# Patient Record
Sex: Female | Born: 1980 | Race: White | Hispanic: No | Marital: Single | Smoking: Never smoker
Health system: Southern US, Community
[De-identification: ages and names within clinical notes are randomized; demographics above are authoritative.]

## PROBLEM LIST (undated history)

## (undated) DIAGNOSIS — Z789 Other specified health status: Secondary | ICD-10-CM

## (undated) HISTORY — PX: OTHER SURGICAL HISTORY: SHX169

---

## 2003-01-15 ENCOUNTER — Other Ambulatory Visit: Admission: RE | Admit: 2003-01-15 | Discharge: 2003-01-15 | Payer: Self-pay | Admitting: Obstetrics and Gynecology

## 2003-07-09 ENCOUNTER — Inpatient Hospital Stay (HOSPITAL_COMMUNITY): Admission: RE | Admit: 2003-07-09 | Discharge: 2003-07-13 | Payer: Self-pay | Admitting: Obstetrics and Gynecology

## 2003-07-09 ENCOUNTER — Encounter (INDEPENDENT_AMBULATORY_CARE_PROVIDER_SITE_OTHER): Payer: Self-pay | Admitting: Specialist

## 2005-05-17 ENCOUNTER — Emergency Department (HOSPITAL_COMMUNITY): Admission: EM | Admit: 2005-05-17 | Discharge: 2005-05-17 | Payer: Self-pay | Admitting: Family Medicine

## 2007-11-05 ENCOUNTER — Emergency Department (HOSPITAL_COMMUNITY): Admission: EM | Admit: 2007-11-05 | Discharge: 2007-11-05 | Payer: Self-pay | Admitting: Emergency Medicine

## 2008-10-12 ENCOUNTER — Emergency Department (HOSPITAL_COMMUNITY): Admission: EM | Admit: 2008-10-12 | Discharge: 2008-10-12 | Payer: Self-pay | Admitting: Emergency Medicine

## 2009-03-18 ENCOUNTER — Emergency Department (HOSPITAL_COMMUNITY): Admission: EM | Admit: 2009-03-18 | Discharge: 2009-03-18 | Payer: Self-pay | Admitting: Family Medicine

## 2009-04-27 ENCOUNTER — Encounter: Admission: RE | Admit: 2009-04-27 | Discharge: 2009-04-27 | Payer: Self-pay | Admitting: Obstetrics and Gynecology

## 2009-05-25 ENCOUNTER — Emergency Department (HOSPITAL_COMMUNITY): Admission: EM | Admit: 2009-05-25 | Discharge: 2009-05-25 | Payer: Self-pay | Admitting: Family Medicine

## 2010-08-26 NOTE — Discharge Summary (Signed)
NAME:  Shirley Ryan, Shirley Ryan                        ACCOUNT NO.:  [id number]   MEDICAL RECORD NO.:  [id number]                   PATIENT TYPE:  INP   LOCATION:  9116                                 FACILITY:  [facility]   PHYSICIAN:  Malachi Pro. Ambrose Mantle, M.D.              DATE OF BIRTH:  24-Jun-1980   DATE OF ADMISSION:  07/09/2003  DATE OF DISCHARGE:                                 DISCHARGE SUMMARY   HOSPITAL COURSE:  This is a 30 year old white female para 0-0-2-0 gravida 3  with [facility] July 13, 2003 admitted for primary cesarean section because of  breech presentation.  Blood group and type A positive, negative antibody,  negative sickle cell, nonreactive serology, rubella immune, hepatitis B  surface antigen negative, GC and chlamydia negative, triple screen normal, 1-  hour Glucola 117, group B strep positive.  The patient was noted to have a  breech presentation, it has persisted, and she is admitted for C-section.  She underwent a low transverse cervical C-section by Dr. Ambrose Mantle with Dr.  Senaida Ores assisting under spinal anesthesia, a female infant, 9 pounds 0  ounces, Apgars 9 at one and 9 at five minutes.  Blood loss about 500 mL.  Postpartum the patient did very well.  She ambulated well without  difficulty, tolerated a regular diet, passed flatus and had a bowel  movement.  The staples were removed, strips were applied, and she was  discharged on postpartum day #4.  She did not want earlier discharge.   LABORATORY DATA:  Admission hemoglobin 11.3; hematocrit 33.7; white count  10,500; platelet count 231,000.  Follow-up hemoglobin 9.0; hematocrit 26.5;  white count 12,000.  Urinalysis showed 30 mg% protein.  Comprehensive  metabolic profile was normal except for abnormalities usually associated  with pregnancy.   FINAL DIAGNOSES:  1. Intrauterine pregnancy at 39+ weeks.  2. Breech presentation.   OPERATION:  Low transverse cervical C-section.   FINAL CONDITION:  Improved.   Instructions  include our regular discharge instruction booklet.  The patient  is advised to return to the office in 10 days to 2 weeks for follow-up  examination.  Percocet 5/325 #20 tablets one q.4-6h. as needed for pain is  given at discharge.                                               Malachi Pro. Ambrose Mantle, M.D.    TFH/MEDQ  D:  07/13/2003  T:  07/13/2003  Job:  [id number]

## 2010-08-26 NOTE — Op Note (Signed)
NAME:  Shirley Ryan, Shirley Ryan                        ACCOUNT NO.:  [id number]   MEDICAL RECORD NO.:  [id number]                   PATIENT TYPE:  INP   LOCATION:  9199                                 [facility]ILITY[facility]  [facility]   PHYSICIAN:  Malachi Pro. Ambrose Mantle, M.D.              DATE OF BIRTH:  Nov 28, 1980   DATE OF PROCEDURE:  07/09/2003  DATE OF DISCHARGE:                                 OPERATIVE REPORT   PREOPERATIVE DIAGNOSES:  1. Intrauterine pregnancy at 39 weeks.  2. Breech presentation.   POSTOPERATIVE DIAGNOSES:  1. Intrauterine pregnancy at 39 weeks.  2. Breech presentation.   OPERATION:  Low transverse cervical cesarean section.   OPERATOR:  Malachi Pro. Ambrose Mantle, M.D.   ASSISTANT:  Huel Cote, M.D.   Spinal anesthesia.   The patient was brought to the operating room, fetal heart tones were  normal.  She was given a spinal anesthetic by Dr. Tacy Dura.  She was placed in  the left lateral tilt position with her legs spread.  The abdomen and  urethra were prepped with Betadine solution.  A Foley catheter was inserted  to straight drain.  The patient was then placed supine in a left lateral  tilt position.  The abdomen was draped as a sterile field.  Anesthesia was  confirmed and a transverse incision was made and carried in layers through  the skin, subcutaneous tissue, and fascia.  The fascia was then separated  from the rectus muscle superiorly and inferiorly.  The rectus muscle was  split in the midline and the peritoneum was opened vertically. An incision  was made into the lower uterine segment peritoneum and it was extended  laterally, and then the bladder was pushed inferiorly.  An incision was made  into the myometrium but then the final part of the entry into the amniotic  sac was made with my finger.  I then separated the uterine muscle by pulling  inferiorly and superiorly on the incision.  The baby's breech was sitting  under the incision.  I gradually with Dr. Berenda Morale  assistance on fundal  pressure brought the butt through the incisional opening, delivered the baby  up to the shoulders.  The left shoulder delivered first, shoulder and arm,  and the right shoulder delivered second.  I then was able to grasp the  baby's mouth and pull the baby on through the incisional opening with fundal  pressure, and then I placed a little suprapubic pressure to get the head on  out.  The cord was clamped.  The infant was given to Dr. Alison Murray.  He  assigned the baby's Apgars of 9 at one and 9 at five minutes.  The baby was  a female, and it weighed 9 pounds 0 ounces.  Routine cord blood studies were  obtained.  The placenta was removed.  The cervix was dilated with ring  forceps.  The inside of the uterus was inspected  and found to be free of  debris.  The uterine incision was then closed with two running sutures of 0  Vicryl, locking the first layer, nonlocking on the second layer.  A couple  of extra sutures were required for complete hemostasis.  Liberal irrigation  confirmed hemostasis.  Both tubes and ovaries appeared normal, as did the  uterus.  I then closed the rectus muscle with interrupted sutures of 0  Vicryl.  The  fascia was closed with two running sutures of 0 Vicryl, the subcu with a  running 3-0 Vicryl, and the skin was closed with automatic staples.  The  patient seemed to tolerate the procedure well.  Blood loss was about 500 mL.  Sponge and needle counts were correct, and she was returned to recovery in  satisfactory condition.                                               Malachi Pro. Ambrose Mantle, M.D.    TFH/MEDQ  D:  07/09/2003  T:  07/09/2003  Job:  [id number]

## 2010-08-26 NOTE — H&P (Signed)
NAME:  Shirley Ryan, Shirley Ryan                        ACCOUNT NO.:  [id number]   MEDICAL RECORD NO.:  [id number]                   PATIENT TYPE:  INP   LOCATION:  NA                                   [facility]   PHYSICIAN:  Malachi Pro. Ambrose Mantle, M.D.              DATE OF BIRTH:  07-03-1980   DATE OF ADMISSION:  DATE OF DISCHARGE:                                HISTORY & PHYSICAL   HISTORY OF PRESENT ILLNESS:  This is a 30 year old white female, para 0-0-2-  0, gravida 3, with [facility] July 13, 2003, by ultrasound at 13 weeks and 1 day,  admitted for C-section because of breech presentation.  This patient  underwent a vaginal ultrasound on January 06, 2003.  The crown/rump length  was 5.95 cm, 13 weeks 1 day, and [facility] July 13, 2003.  Followup ultrasound on  February 11, 2003, showed gestational age [redacted] weeks 2 days and [facility] July 13, 2003.  The cervix was 4.1 cm long and closed and all anatomy was felt to be  normal.  The patient's blood group and type is A positive.  Negative  antibody.  Negative sickle cell.  Nonreactive serology.  Rubella immune.  Hepatitis B surface antigen negative.  GC and chlamydia negative.  Triple  screen normal.  One-hour Glucola 117.  Group B Streptococcus positive.  The  patient's urine at her prenatal visit showed group B Streptococcus.  She was  treated with penicillin.  Her subsequent prenatal course has been relatively  unremarkable.  She had no major problems.  On her visit on July 02, 2003,  she was noted to have breech presentation.  It was still felt to be breech  on July 08, 2003.  She was offered inversion on July 02, 2003, but  declined and preferred to proceed with C-section.   ALLERGIES:  No known allergies.   PAST SURGICAL HISTORY:  No operations.   PAST MEDICAL HISTORY:  Illnesses:  1. Pyelonephritis as a child.  2. Some anxiety.   OBSTETRICAL HISTORY:  Two early abortions in 2002 and 2003.   FAMILY HISTORY:  Maternal great-grandmother with heart  disease and high  blood pressure.  Maternal grandmother with breast cancer.  Maternal aunt  with Hodgkin's disease.  Mother has migraines, anxiety, and depression.  Paternal grandfather with diabetes.   PHYSICAL EXAMINATION:  GENERAL APPEARANCE:  A well-developed, well-  developed, white female in no distress.  VITAL SIGNS:  Blood pressure 130/84, pulse 80.  HEENT:  Normal.  HEART:  Normal.  LUNGS:  Normal.  ABDOMEN:  Soft.  Fundal height 40 cm.  Vertex in the maternal right upper  quadrant.  Breech presentation.  PELVIC:  Cervix a fingertip and long.   ADMITTING IMPRESSION:  Intrauterine pregnancy, 39+ weeks, with breech  presentation.  The patient is admitted for C-section.  Malachi Pro. Ambrose Mantle, M.D.    TFH/MEDQ  D:  07/08/2003  T:  07/08/2003  Job:  [id number]

## 2012-07-17 ENCOUNTER — Other Ambulatory Visit: Payer: Self-pay | Admitting: Obstetrics and Gynecology

## 2012-07-17 DIAGNOSIS — N63 Unspecified lump in unspecified breast: Secondary | ICD-10-CM

## 2012-07-18 ENCOUNTER — Other Ambulatory Visit: Payer: Self-pay | Admitting: Obstetrics and Gynecology

## 2012-07-18 DIAGNOSIS — N63 Unspecified lump in unspecified breast: Secondary | ICD-10-CM

## 2012-07-31 ENCOUNTER — Other Ambulatory Visit: Payer: Self-pay | Admitting: Obstetrics and Gynecology

## 2012-07-31 ENCOUNTER — Ambulatory Visit
Admission: RE | Admit: 2012-07-31 | Discharge: 2012-07-31 | Disposition: A | Discharge status: Home / Self Care | Payer: BC Managed Care – PPO | Source: Ambulatory Visit | Attending: Obstetrics and Gynecology | Admitting: Obstetrics and Gynecology

## 2012-07-31 DIAGNOSIS — N63 Unspecified lump in unspecified breast: Secondary | ICD-10-CM

## 2013-07-29 ENCOUNTER — Other Ambulatory Visit: Payer: Self-pay | Admitting: Obstetrics and Gynecology

## 2013-07-29 DIAGNOSIS — N6311 Unspecified lump in the right breast, upper outer quadrant: Secondary | ICD-10-CM

## 2013-07-29 DIAGNOSIS — N6325 Unspecified lump in the left breast, overlapping quadrants: Secondary | ICD-10-CM

## 2013-07-29 DIAGNOSIS — N6320 Unspecified lump in the left breast, unspecified quadrant: Secondary | ICD-10-CM

## 2013-08-06 ENCOUNTER — Ambulatory Visit
Admission: RE | Admit: 2013-08-06 | Discharge: 2013-08-06 | Disposition: A | Discharge status: Home / Self Care | Payer: No Typology Code available for payment source | Source: Ambulatory Visit | Attending: Obstetrics and Gynecology | Admitting: Obstetrics and Gynecology

## 2013-08-06 ENCOUNTER — Encounter (INDEPENDENT_AMBULATORY_CARE_PROVIDER_SITE_OTHER): Payer: Self-pay

## 2013-08-06 DIAGNOSIS — N6325 Unspecified lump in the left breast, overlapping quadrants: Secondary | ICD-10-CM

## 2013-08-06 DIAGNOSIS — N6311 Unspecified lump in the right breast, upper outer quadrant: Secondary | ICD-10-CM

## 2013-08-06 DIAGNOSIS — N6320 Unspecified lump in the left breast, unspecified quadrant: Secondary | ICD-10-CM

## 2014-05-28 ENCOUNTER — Emergency Department (INDEPENDENT_AMBULATORY_CARE_PROVIDER_SITE_OTHER)
Admission: EM | Admit: 2014-05-28 | Discharge: 2014-05-28 | Disposition: A | Discharge status: Home / Self Care | Payer: Self-pay | Source: Home / Self Care | Attending: Family Medicine | Admitting: Family Medicine

## 2014-05-28 ENCOUNTER — Encounter (HOSPITAL_COMMUNITY): Payer: Self-pay | Admitting: Emergency Medicine

## 2014-05-28 DIAGNOSIS — T63461A Toxic effect of venom of wasps, accidental (unintentional), initial encounter: Secondary | ICD-10-CM

## 2014-05-28 NOTE — Discharge Instructions (Signed)
As we discussed, application of ice packs, ibuprofen for discomfort and topical benadryl and topical hydrocortisone creams for redness and itching. May also use oral benadryl as directed on packaging. Expect improvement over the next few days. If symptoms worsen, please return for re-evaluation.  Bee, Wasp, or Hornet Sting Your caregiver has diagnosed you as having an insect sting. An insect sting appears as a red lump in the skin that sometimes has a tiny hole in the center, or it may have a stinger in the center of the wound. The most common stings are from wasps, hornets and bees. Individuals have different reactions to insect stings.  A normal reaction may cause pain, swelling, and redness around the sting site.  A localized allergic reaction may cause swelling and redness that extends beyond the sting site.  A large local reaction may continue to develop over the next 12 to 36 hours.  On occasion, the reactions can be severe (anaphylactic reaction). An anaphylactic reaction may cause wheezing; difficulty breathing; chest pain; fainting; raised, itchy, red patches on the skin; a sick feeling to your stomach (nausea); vomiting; cramping; or diarrhea. If you have had an anaphylactic reaction to an insect sting in the past, you are more likely to have one again. HOME CARE INSTRUCTIONS   With bee stings, a small sac of poison is left in the wound. Brushing across this with something such as a credit card, or anything similar, will help remove this and decrease the amount of the reaction. This same procedure will not help a wasp sting as they do not leave behind a stinger and poison sac.  Apply a cold compress for 10 to 20 minutes every hour for 1 to 2 days, depending on severity, to reduce swelling and itching.  To lessen pain, a paste made of water and baking soda may be rubbed on the bite or sting and left on for 5 minutes.  To relieve itching and swelling, you may use take medication or apply  medicated creams or lotions as directed.  Only take over-the-counter or prescription medicines for pain, discomfort, or fever as directed by your caregiver.  Wash the sting site daily with soap and water. Apply antibiotic ointment on the sting site as directed.  If you suffered a severe reaction:  If you did not require hospitalization, an adult will need to stay with you for 24 hours in case the symptoms return.  You may need to wear a medical bracelet or necklace stating the allergy.  You and your family need to learn when and how to use an anaphylaxis kit or epinephrine injection.  If you have had a severe reaction before, always carry your anaphylaxis kit with you. SEEK MEDICAL CARE IF:   None of the above helps within 2 to 3 days.  The area becomes red, warm, tender, and swollen beyond the area of the bite or sting.  You have an oral temperature above 102 F (38.9 C). SEEK IMMEDIATE MEDICAL CARE IF:  You have symptoms of an allergic reaction which are:  Wheezing.  Difficulty breathing.  Chest pain.  Lightheadedness or fainting.  Itchy, raised, red patches on the skin.  Nausea, vomiting, cramping or diarrhea. ANY OF THESE SYMPTOMS MAY REPRESENT A SERIOUS PROBLEM THAT IS AN EMERGENCY. Do not wait to see if the symptoms will go away. Get medical help right away. Call your local emergency services (911 in [place] DO NOT drive yourself to the hospital. MAKE SURE YOU:   Understand these  instructions.  Will watch your condition.  Will get help right away if you are not doing well or get worse. Document Released: 03/27/2005 Document Revised: 06/19/2011 Document Reviewed: 09/11/2009 [facility] Patient Information 2015 [place], [place]. This information is not intended to replace advice given to you by your health care provider. Make sure you discuss any questions you have with your health care provider.

## 2014-05-28 NOTE — ED Notes (Signed)
Pt states that she was stung by a wasp on 05/25/2014. Pt states site is getting worse and more red.

## 2014-05-28 NOTE — ED Provider Notes (Signed)
CSN: [id number]     Arrival date & time 05/28/14  0806 History   First MD Initiated Contact with Patient 05/28/14 [phone]     Chief Complaint  Patient presents with  . Insect Bite    wasp sting   (Consider location/radiation/quality/duration/timing/severity/associated sxs/prior Treatment) HPI Comments: Patient reports herself to have been stung by a wasp on 05/26/2014. She has tried taking a few doses of oral benadryl for itching and burning sensation at site and this has brought some improvement. Area remains a bit sore and erythematous and she come for evaluation to learn when redness will resolve and if she should be treating area any differently at home.   The history is provided by the patient.    History reviewed. No pertinent past medical history. History reviewed. No pertinent past surgical history. History reviewed. No pertinent family history. History  Substance Use Topics  . Smoking status: Never Smoker   . Smokeless tobacco: Not on file  . Alcohol Use: No   OB History    No data available     Review of Systems  All other systems reviewed and are negative.   Allergies  Review of patient's allergies indicates no known allergies.  Home Medications   Prior to Admission medications   Not on File   BP 138/95 mmHg  Pulse 60  Temp(Src) 98.3 F (36.8 C) (Oral)  Resp 14  SpO2 100%  LMP 05/27/2014 Physical Exam  Constitutional: She is oriented to person, place, and time. She appears well-developed and well-nourished. No distress.  HENT:  Head: Normocephalic and atraumatic.  Eyes: Conjunctivae are normal.  Cardiovascular: Normal rate.   Pulmonary/Chest: Effort normal.  Neurological: She is alert and oriented to person, place, and time.  Skin: Skin is warm and dry. There is erythema.  4x5 cm oval shaped area of erythema at LLQ of abdomen. No fluctuance or induration. No retained stinger  Psychiatric: She has a normal mood and affect. Her behavior is normal.   Nursing note and vitals reviewed.   ED Course  Procedures (including critical care time) Labs Review Labs Reviewed - No data to display  Imaging Review No results found.   MDM   1. Wasp sting, accidental or unintentional, initial encounter   As we discussed, application of ice packs, ibuprofen for discomfort and topical benadryl and topical hydrocortisone creams for redness and itching. May also use oral benadryl as directed on packaging. Expect improvement over the next few days. If symptoms worsen, please return for re-evaluation.    Ria ClockJennifer Lee H Woodie Trusty, GeorgiaPA 05/28/14 1020

## 2014-07-27 ENCOUNTER — Emergency Department (HOSPITAL_COMMUNITY)
Admission: EM | Admit: 2014-07-27 | Discharge: 2014-07-27 | Disposition: A | Discharge status: Home / Self Care | Payer: Self-pay

## 2014-09-12 ENCOUNTER — Encounter (HOSPITAL_COMMUNITY): Payer: Self-pay | Admitting: *Deleted

## 2014-09-12 ENCOUNTER — Emergency Department (HOSPITAL_COMMUNITY)
Admission: EM | Admit: 2014-09-12 | Discharge: 2014-09-12 | Disposition: A | Discharge status: Home / Self Care | Payer: Self-pay | Attending: Emergency Medicine | Admitting: Emergency Medicine

## 2014-09-12 DIAGNOSIS — S161XXA Strain of muscle, fascia and tendon at neck level, initial encounter: Secondary | ICD-10-CM | POA: Insufficient documentation

## 2014-09-12 DIAGNOSIS — Y9389 Activity, other specified: Secondary | ICD-10-CM | POA: Insufficient documentation

## 2014-09-12 DIAGNOSIS — Y998 Other external cause status: Secondary | ICD-10-CM | POA: Insufficient documentation

## 2014-09-12 DIAGNOSIS — Y92410 Unspecified street and highway as the place of occurrence of the external cause: Secondary | ICD-10-CM | POA: Insufficient documentation

## 2014-09-12 MED ORDER — ACETAMINOPHEN 325 MG PO TABS
650.0000 mg | ORAL_TABLET | Freq: Once | ORAL | Status: AC
  Administered 2014-09-12: 650 mg via ORAL
  Filled 2014-09-12: qty 2

## 2014-09-12 MED ORDER — METHOCARBAMOL 500 MG PO TABS: 1000.0000 mg | ORAL_TABLET | Freq: Four times a day (QID) | ORAL | Status: DC

## 2014-09-12 NOTE — ED Notes (Signed)
Declined W/C at D/C and was escorted to lobby by RN. 
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
## 2016-02-15 ENCOUNTER — Other Ambulatory Visit: Payer: Self-pay | Admitting: Family Medicine

## 2016-02-16 LAB — CMP12+LP+TP+TSH+6AC+CBC/D/PLT
ALK PHOS: 67 IU/L (ref 39–117)
ALT: 12 IU/L (ref 0–32)
AST: 11 IU/L (ref 0–40)
Albumin/Globulin Ratio: 2 (ref 1.2–2.2)
Albumin: 4.3 g/dL (ref 3.5–5.5)
BASOS: 0 %
BUN/Creatinine Ratio: 14 (ref 9–23)
BUN: 9 mg/dL (ref 6–20)
Basophils Absolute: 0 10*3/uL (ref 0.0–0.2)
Bilirubin Total: 0.6 mg/dL (ref 0.0–1.2)
CALCIUM: 8.7 mg/dL (ref 8.7–10.2)
CHLORIDE: 102 mmol/L (ref 96–106)
CHOL/HDL RATIO: 3.2 ratio (ref 0.0–4.4)
CREATININE: 0.64 mg/dL (ref 0.57–1.00)
Cholesterol, Total: 160 mg/dL (ref 100–199)
EOS (ABSOLUTE): 0.1 10*3/uL (ref 0.0–0.4)
Eos: 1 %
Free Thyroxine Index: 1.6 (ref 1.2–4.9)
GFR, EST AFRICAN AMERICAN: 135 mL/min/{1.73_m2} (ref >59)
GFR, EST NON AFRICAN AMERICAN: 117 mL/min/{1.73_m2} (ref >59)
GGT: 15 IU/L (ref 0–60)
GLOBULIN, TOTAL: 2.2 g/dL (ref 1.5–4.5)
GLUCOSE: 84 mg/dL (ref 65–99)
HDL: 50 mg/dL (ref >39)
HEMATOCRIT: 36.7 % (ref 34.0–46.6)
Hemoglobin: 12.1 g/dL (ref 11.1–15.9)
IMMATURE GRANS (ABS): 0 10*3/uL (ref 0.0–0.1)
Immature Granulocytes: 0 %
Iron: 90 ug/dL (ref 27–159)
LDH: 200 IU/L (ref 119–226)
LDL CALC: 98 mg/dL (ref 0–99)
Lymphocytes Absolute: 2.7 10*3/uL (ref 0.7–3.1)
Lymphs: 35 %
MCH: 29.8 pg (ref 26.6–33.0)
MCHC: 33 g/dL (ref 31.5–35.7)
MCV: 90 fL (ref 79–97)
MONOCYTES: 7 %
MONOS ABS: 0.5 10*3/uL (ref 0.1–0.9)
NEUTROS ABS: 4.3 10*3/uL (ref 1.4–7.0)
Neutrophils: 57 %
PHOSPHORUS: 2.9 mg/dL (ref 2.5–4.5)
POTASSIUM: 3.8 mmol/L (ref 3.5–5.2)
Platelets: 273 10*3/uL (ref 150–379)
RBC: 4.06 x10E6/uL (ref 3.77–5.28)
RDW: 12.7 % (ref 12.3–15.4)
SODIUM: 140 mmol/L (ref 134–144)
T3 Uptake Ratio: 24 % (ref 24–39)
T4 TOTAL: 6.8 ug/dL (ref 4.5–12.0)
TSH: 1.3 u[IU]/mL (ref 0.450–4.500)
Total Protein: 6.5 g/dL (ref 6.0–8.5)
Triglycerides: 62 mg/dL (ref 0–149)
URIC ACID: 4.1 mg/dL (ref 2.5–7.1)
VLDL Cholesterol Cal: 12 mg/dL (ref 5–40)
WBC: 7.7 10*3/uL (ref 3.4–10.8)

## 2016-02-16 LAB — HGB A1C W/O EAG: HEMOGLOBIN A1C: 5 % (ref 4.8–5.6)

## 2016-08-22 ENCOUNTER — Ambulatory Visit: Payer: Self-pay | Admitting: Registered Nurse

## 2016-08-22 VITALS — BP 101/70 | HR 84 | Temp 98.6°F

## 2016-08-22 DIAGNOSIS — N63 Unspecified lump in unspecified breast: Secondary | ICD-10-CM | POA: Insufficient documentation

## 2016-08-22 DIAGNOSIS — W57XXXA Bitten or stung by nonvenomous insect and other nonvenomous arthropods, initial encounter: Principal | ICD-10-CM

## 2016-08-22 DIAGNOSIS — S30861A Insect bite (nonvenomous) of abdominal wall, initial encounter: Secondary | ICD-10-CM

## 2016-08-22 DIAGNOSIS — R81 Glycosuria: Secondary | ICD-10-CM | POA: Insufficient documentation

## 2016-08-22 MED ORDER — DOXYCYCLINE HYCLATE 100 MG PO TABS: 200.0000 mg | ORAL_TABLET | Freq: Once | ORAL | 0 refills | Status: AC

## 2016-08-22 MED ORDER — DIPHENHYDRAMINE HCL 25 MG PO CAPS: 25.0000 mg | ORAL_CAPSULE | Freq: Three times a day (TID) | ORAL | 0 refills | Status: DC | PRN

## 2016-08-22 NOTE — Progress Notes (Signed)
Pt reports tick bite on Friday. Small, brown tick. Was attached. Did remove head from site. Now with approx 1.5cm area of erythema around site. No streaking. Mild swelling, improved from yesterday. Pt denies pain at site.

## 2016-08-22 NOTE — Patient Instructions (Signed)
Doxycyline 200mg  by mouth once now with food May continue benadryl 25-50mg  by mouth every 4-6 hours as needed for itching OTC Shower with soap and water daily Do not itch area to prevent secondary skin infection Follow up for re-evaluation if enlarging rash, fever, joint aches, purulent discharge, muscle aches, headache, flu like symptoms  Tick Bite Information, Adult Ticks are insects that draw blood for food. Most ticks live in shrubs and grassy areas. They climb onto people and animals that brush against the leaves and grasses that they rest on. Then they bite, attaching themselves to the skin. Most ticks are harmless, but some ticks carry germs that can spread to a person through a bite and cause a disease. To reduce your risk of getting a disease from a tick bite, it is important to take steps to prevent tick bites. It is also important to check for ticks after being outdoors. If you find that a tick has attached to you, watch for symptoms of disease. How can I prevent tick bites? Take these steps to help prevent tick bites when you are outdoors in an area where ticks are found:  Use insect repellent that has DEET (20% or higher), picaridin, or IR3535 in it. Use it on:  Skin that is showing.  The top of your boots.  Your pant legs.  Your sleeve cuffs.  For repellent products that contain permethrin, follow product instructions. Use these products on:  Clothing.  Gear.  Boots.  Tents.  Wear protective clothing. Long sleeves and long pants offer the best protection from ticks.  Wear light-colored clothing so you can see ticks more easily.  Tuck your pant legs into your socks.  If you go walking on a trail, stay in the middle of the trail so your skin, hair, and clothing do not touch the bushes.  Avoid walking through areas with long grass.  Check for ticks on your clothing, hair, and skin often while you are outside, and check again before you go inside. Make sure to  check the places that ticks attach themselves most often. These places include the scalp, neck, armpits, waist, groin, and joint areas. Ticks that carry a disease called Lyme disease have to be attached to the skin for 24-48 hours. Checking for ticks every day will lessen your risk of this and other diseases.  When you come indoors, wash your clothes and take a shower or a bath right away. Dry your clothes in a dryer on high heat for at least 60 minutes. This will kill any ticks in your clothes. What is the proper way to remove a tick? If you find a tick on your body, remove it as soon as possible. Removing a tick sooner rather than later can prevent germs from passing from the tick to your body. To remove a tick that is crawling on your skin but has not bitten:  Go outdoors and brush the tick off.  Remove the tick with tape or a lint roller. To remove a tick that is attached to your skin:  Wash your hands.  If you have latex gloves, put them on.  Use tweezers, curved forceps, or a tick-removal tool to gently grasp the tick as close to your skin and the tick's head as possible.  Gently pull with steady, upward pressure until the tick lets go. When removing the tick:  Take care to keep the tick's head attached to its body.  Do not twist or jerk the tick. This can  make the tick's head or mouth break off.  Do not squeeze or crush the tick's body. This could force disease-carrying fluids from the tick into your body. Do not try to remove a tick with heat, alcohol, petroleum jelly, or fingernail polish. Using these methods can cause the tick to salivate and regurgitate into your bloodstream, increasing your risk of getting a disease. What should I do after removing a tick?  Clean the bite area with soap and water, rubbing alcohol, or an iodine scrub.  If an antiseptic cream or ointment is available, apply a small amount to the bite site.  Wash and disinfect any instruments that you used to  remove the tick. How should I dispose of a tick? To dispose of a live tick, use one of these methods:  Place it in rubbing alcohol.  Place it in a sealed bag or container.  Wrap it tightly in tape.  Flush it down the toilet. Contact a health care provider if:  You have symptoms of a disease after a tick bite. Symptoms of a tick-borne disease can occur from moments after the tick bites to up to 30 days after a tick is removed. Symptoms include:  Muscle, joint, or bone pain.  Difficulty walking or moving your legs.  Numbness in the legs.  Paralysis.  Red rash around the tick bite area that is shaped like a target or a "bull's-eye."  Redness and swelling in the area of the tick bite.  Fever.  Repeated vomiting.  Diarrhea.  Weight loss.  Tender, swollen lymph glands.  Shortness of breath.  Cough.  Pain in the abdomen.  Headache.  Abnormal tiredness.  A change in your level of consciousness.  Confusion. Get help right away if:  You are not able to remove a tick.  A part of a tick breaks off and gets stuck in your skin.  Your symptoms get worse. Summary  Ticks may carry germs that can spread to a person through a bite and cause disease.  Wear protective clothing and use insect repellent to prevent tick bites. Follow product instructions.  If you find a tick on your body, remove it as soon as possible. If the tick is attached, do not try to remove with heat, alcohol, petroleum jelly, or fingernail polish.  Remove the attached tick using tweezers, curved forceps, or a tick-removal tool. Gently pull with steady, upward pressure until the tick lets go. Do not twist or jerk the tick. Do not squeeze or crush the tick's body.  If you have symptoms after being bitten by a tick, contact a health care provider. This information is not intended to replace advice given to you by your health care provider. Make sure you discuss any questions you have with your health  care provider. Document Released: 03/24/2000 Document Revised: 01/07/2016 Document Reviewed: 01/07/2016 Elsevier Interactive Patient Education  2017 [employer] [facility] Spotted F[facility] spotted fever is an illness that is spread to people by infected ticks. The illness causes flulike symptoms and a reddish-purple rash. This illness can quickly become very serious. Treatment must be started right away. When the illness is not treated right away, it can sometimes lead to long-term health problems or even death. This illness is most common during warm weather when ticks are most active. What are the causes? [facility] spotted fever is caused by a type of bacteria that is called Rickettsia rickettsii. This type of bacteria is carried by [place] dog ticks and [place] ticks. People get infected through a bite from a tick that is infected with the bacteria. The bite is painless, and it frequently goes unnoticed. The bacteria can also infect a person when tick blood or tick feces get into a person's body through damaged skin. A tick bite is not necessary for an infection to occur. People can get [facility] spotted fever if they get a tick's blood or body fluids on their skin in the area of a small cut or sore. This could happen while removing a tick from another person or a dog. The infection is not contagious, and it cannot be spread (transmitted) from person to person. What are the signs or symptoms? Symptoms may begin 2-14 days after a tick bite. The most common early symptoms are:  Fever.  Muscle aches.  Headache.  Nausea.  Vomiting.  Poor appetite.  Abdominal pain. The reddish-purple rash usually appears 3-5 days after the first symptoms begin. The rash often starts on the wrists and ankles. It may then spread to the palms, the soles of the feet, the legs, and the trunk. How is this diagnosed? Diagnosis is based on a physical exam, medical history,  and blood tests. Your health care provider may suspect [facility] spotted fever in one of these cases:  If you have recently been bitten by a tick.  If you have been in areas that have a lot of ticks or in areas where the disease is common. How is this treated? It is important to begin treatment right away. Treatment will usually involve the use of antibiotic medicines. In some cases, your health care provider may begin treatment before the diagnosis is confirmed. If your symptoms are severe, a hospital stay may be needed. Follow these instructions at home:  Rest as much as possible until you feel better.  Take medicines only as directed by your health care provider.  Take your antibiotic medicine as directed by your health care provider. Finish the antibiotic even if you start to feel better.  Drink enough fluid to keep your urine clear or pale yellow.  Keep all follow-up visits as directed by your health care provider. This is important. How is this prevented? Avoiding tick bites can help to prevent this illness. Take these steps to avoid tick bites when you are outdoors:  Be aware that most ticks live in shrubs, low tree branches, and grassy areas. A tick can climb onto your body when you make contact with leaves or grass where the tick is waiting.  Wear protective clothing. Long sleeves and long pants are best.  Wear white clothes so you can see ticks more easily.  Tuck your pant legs into your socks.  If you go walking on a trail, stay in the middle of the trail to avoid brushing against bushes.  Avoid walking through areas that have long grass.  Put insect repellent on all exposed skin and along boot tops, pant legs, and sleeve cuffs.  Check clothing, hair, and skin repeatedly and before going inside.  Check family members and pets for ticks.  Brush off any ticks that are not attached.  Take a shower or a bath as soon as possible after you have been outdoors. Check  your skin for ticks. The most common places on the body where ticks attach themselves are the scalp, neck, armpits, waist, and groin. You can also greatly reduce your chances of getting [facility] spotted fever if you remove attached ticks as soon  as possible. To remove an attached tick, use a forceps or fine-point tweezers to detach the intact tick without leaving its mouth parts in the skin. The wound from the tick bite should be washed after the tick has been removed. Contact a health care provider if:  You have drainage, swelling, or increased redness or pain in the area of the rash. Get help right away if:  You have chest pain.  You have shortness of breath.  You have a severe headache.  You have a seizure.  You have severe abdominal pain.  You are feeling confused.  You are bruising easily.  You have bleeding from your gums.  You have blood in your stool. This information is not intended to replace advice given to you by your health care provider. Make sure you discuss any questions you have with your health care provider. Document Released: 07/09/2000 Document Revised: 09/02/2015 Document Reviewed: 11/10/2013 Elsevier Interactive Patient Education  2017 [employer]  Lyme Disease Lyme disease is an infection that affects many parts of the body, including the skin, joints, and nervous system. It is a bacterial infection that starts from the bite of an infected tick. The infection can spread, and some of the symptoms are similar to the flu. If Lyme disease is not treated, it may cause joint pain, swelling, numbness, problems thinking, fatigue, muscle weakness, and other problems. What are the causes? This condition is caused by bacteria called Borrelia burgdorferi. You can get Lyme disease by being bitten by an infected tick. The tick must be attached to your skin to pass along the infection. Deer often carry infected ticks. What increases the risk? The following factors  may make you more likely to develop this condition:  Living in or visiting these areas in the [place]  [place].  [place].  [place].  Spending time in wooded or grassy areas.  Being outdoors with exposed skin.  Camping, gardening, hiking, fishing, or hunting outdoors.  Failing to remove a tick from your skin within 3-4 days. What are the signs or symptoms? Symptoms of this condition include:  A round, red rash that surrounds the center of the tick bite. This is the first sign of infection. The center of the rash may be blood colored or have tiny blisters.  Fatigue.  Headache.  Chills and fever.  General achiness.  Joint pain, often in the knees.  Muscle pain.  Swollen lymph glands.  Stiff neck. How is this diagnosed? This condition is diagnosed based on:  Your symptoms and medical history.  A physical exam.  A blood test. How is this treated? The main treatment for this condition is antibiotic medicine, which is usually taken by mouth (orally). The length of treatment depends on how soon after a tick bite you begin taking the medicine. In some cases, treatment is necessary for several weeks. If the infection is severe, antibiotics may need to be given through an IV tube that is inserted into one of your veins. Follow these instructions at home:  Take your antibiotic medicine as told by your health care provider. Do not stop taking the antibiotic even if you start to feel better.  Ask your health care provider about takinga probiotic in between doses of your antibiotic to help avoid stomach upset or diarrhea.  Check with your health care provider before supplementing your treatment. Many alternative therapies have not been proven and may be harmful to you.  Keep all follow-up visits as told  by your health care provider. This is important. How is this prevented? You can become reinfected if you get another tick bite from an infected  tick. Take these steps to help prevent an infection:  Cover your skin with light-colored clothing when you are outdoors in the spring and summer months.  Spray clothing and skin with bug spray. The spray should be 20-30% DEET.  Avoid wooded, grassy, and shaded areas.  Remove yard litter, brush, trash, and plants that attract deer and rodents.  Check yourself for ticks when you come indoors.  Wash clothing worn each day.  Check your pets for ticks before they come inside.  If you find a tick:  Remove it with tweezers.  Clean your hands and the bite area with rubbing alcohol or soap and water. Pregnant women should take special care to avoid tick bites because the infection can be passed along to the fetus. Contact a health care provider if:  You have symptoms after treatment.  You have removed a tick and want to bring it to your health care provider for testing. Get help right away if:  You have an irregular heartbeat.  You have nerve pain.  Your face feels numb. This information is not intended to replace advice given to you by your health care provider. Make sure you discuss any questions you have with your health care provider. Document Released: 07/03/2000 Document Revised: 11/16/2015 Document Reviewed: 11/16/2015 Elsevier Interactive Patient Education  2017 [employer]  Cellulitis, Adult Cellulitis is a skin infection. The infected area is usually red and tender. This condition occurs most often in the arms and lower legs. The infection can travel to the muscles, blood, and underlying tissue and become serious. It is very important to get treated for this condition. What are the causes? Cellulitis is caused by bacteria. The bacteria enter through a break in the skin, such as a cut, burn, insect bite, open sore, or crack. What increases the risk? This condition is more likely to occur in people who:  Have a weak defense system (immune system).  Have open wounds on  the skin such as cuts, burns, bites, and scrapes. Bacteria can enter the body through these open wounds.  Are older.  Have diabetes.  Have a type of long-lasting (chronic) liver disease (cirrhosis) or kidney disease.  Use IV drugs. What are the signs or symptoms? Symptoms of this condition include:  Redness, streaking, or spotting on the skin.  Swollen area of the skin.  Tenderness or pain when an area of the skin is touched.  Warm skin.  Fever.  Chills.  Blisters. How is this diagnosed? This condition is diagnosed based on a medical history and physical exam. You may also have tests, including:  Blood tests.  Lab tests.  Imaging tests. How is this treated? Treatment for this condition may include:  Medicines, such as antibiotic medicines or antihistamines.  Supportive care, such as rest and application of cold or warm cloths (cold or warm compresses) to the skin.  Hospital care, if the condition is severe. The infection usually gets better within 1-2 days of treatment. Follow these instructions at home:  Take over-the-counter and prescription medicines only as told by your health care provider.  If you were prescribed an antibiotic medicine, take it as told by your health care provider. Do not stop taking the antibiotic even if you start to feel better.  Drink enough fluid to keep your urine clear or pale yellow.  Do not  touch or rub the infected area.  Raise (elevate) the infected area above the level of your heart while you are sitting or lying down.  Apply warm or cold compresses to the affected area as told by your health care provider.  Keep all follow-up visits as told by your health care provider. This is important. These visits let your health care provider make sure a more serious infection is not developing. Contact a health care provider if:  You have a fever.  Your symptoms do not improve within 1-2 days of starting treatment.  Your bone or  joint underneath the infected area becomes painful after the skin has healed.  Your infection returns in the same area or another area.  You notice a swollen bump in the infected area.  You develop new symptoms.  You have a general ill feeling (malaise) with muscle aches and pains. Get help right away if:  Your symptoms get worse.  You feel very sleepy.  You develop vomiting or diarrhea that persists.  You notice red streaks coming from the infected area.  Your red area gets larger or turns dark in color. This information is not intended to replace advice given to you by your health care provider. Make sure you discuss any questions you have with your health care provider. Document Released: 01/04/2005 Document Revised: 08/05/2015 Document Reviewed: 02/03/2015 Elsevier Interactive Patient Education  2017 [employer].

## 2016-08-22 NOTE — Progress Notes (Signed)
Subjective:    Patient ID: Shirley Ryan, female    DOB: June 10, 1980, 36 y.o.   MRN: [id number]  36y/o caucasian female reports tick bite on Friday. Small, brown tick. Was attached and enlarged large size of pencil head eraser full of blood. Did remove head from site. Now with approx 1.5cm area of erythema around site. No streaking. Mild swelling, improved from yesterday. Pt denies pain at site.   Has tried po benadryl with some relief of itching; hydrocortisone lotion worsened itching and redness.  Has a few bumps that are not tender.  Denied lymph node swelling, fever, chills, nausea, headache, myalgias, vomiting, joint aches, rash other parts of body, fatigue, and/or fever/chills.      Pt reports tick bite on Friday. Small, brown tick. Was attached. Did remove head from site. Now with approx 1.5cm area of erythema around site. No streaking. Mild swelling, improved from yesterday. Pt denies pain at site.     Review of Systems  Constitutional: Negative for activity change, appetite change, chills, diaphoresis, fatigue and fever.  HENT: Negative for congestion, ear pain and sore throat.   Eyes: Negative for pain and discharge.  Respiratory: Negative for cough and wheezing.   Cardiovascular: Negative for chest pain and leg swelling.  Gastrointestinal: Negative for blood in stool, constipation, diarrhea, nausea and vomiting.  Endocrine: Negative for cold intolerance and heat intolerance.  Genitourinary: Negative for difficulty urinating, dysuria and hematuria.  Musculoskeletal: Negative for arthralgias, back pain, gait problem, joint swelling, myalgias, neck pain and neck stiffness.  Skin: Positive for color change and rash. Negative for pallor and wound.  Allergic/Immunologic: Negative for environmental allergies and food allergies.  Neurological: Negative for headaches.  Hematological: Negative for adenopathy. Does not bruise/bleed easily.  Psychiatric/Behavioral: Negative for  agitation, confusion and sleep disturbance. The patient is not nervous/anxious.        Objective:   Physical Exam  Constitutional: She is oriented to person, place, and time. Vital signs are normal. She appears well-developed and well-nourished.  Non-toxic appearance. She does not have a sickly appearance. She does not appear ill. No distress.  HENT:  Head: Normocephalic and atraumatic.  Right Ear: Hearing and external ear normal.  Left Ear: Hearing and external ear normal.  Nose: Nose normal.  Mouth/Throat: Uvula is midline, oropharynx is clear and moist and mucous membranes are normal. No oropharyngeal exudate.  Eyes: Conjunctivae, EOM and lids are normal. Pupils are equal, round, and reactive to light. Right eye exhibits no discharge. Left eye exhibits no discharge. No scleral icterus.  Neck: Trachea normal and normal range of motion. Neck supple. No tracheal deviation present. No thyromegaly present.  Cardiovascular: Normal rate, regular rhythm, normal heart sounds, intact distal pulses and normal pulses.   Pulses:      Radial pulses are 2+ on the right side, and 2+ on the left side.  Pulmonary/Chest: Effort normal and breath sounds normal. No stridor. No respiratory distress. She has no wheezes.  Abdominal: Soft. She exhibits no distension, no fluid wave, no ascites and no mass. There is no hepatosplenomegaly. There is no tenderness. There is no rigidity, no rebound, no guarding, no tenderness at McBurney's point and negative Murphy's sign. Hernia confirmed negative in the ventral area.    Macular erythema RLQ with papule and pustule 2mm central of 2x1cm erythema not TTP no fluctuance  Musculoskeletal: Normal range of motion. She exhibits no edema, tenderness or deformity.       Right shoulder: Normal.  Left shoulder: Normal.       Right elbow: Normal.      Left elbow: Normal.       Right hip: Normal.       Left hip: Normal.       Right knee: Normal.       Left knee: Normal.         Cervical back: Normal.       Thoracic back: Normal.       Lumbar back: Normal.       Right hand: Normal.       Left hand: Normal.  Lymphadenopathy:       Head (right side): No submental, no submandibular, no tonsillar, no preauricular, no posterior auricular and no occipital adenopathy present.       Head (left side): No submental, no submandibular, no tonsillar, no preauricular, no posterior auricular and no occipital adenopathy present.    She has no cervical adenopathy.       Right: No inguinal adenopathy present.       Left: No inguinal adenopathy present.  Neurological: She is alert and oriented to person, place, and time. She is not disoriented. She displays no atrophy and no tremor. No cranial nerve deficit or sensory deficit. She exhibits normal muscle tone. She displays no seizure activity. Coordination and gait normal. GCS eye subscore is 4. GCS verbal subscore is 5. GCS motor subscore is 6.  Gait sure and steady hallway/exam room; in/out of chair and on/off exam table without difficulty  Skin: Skin is warm, dry and intact. Rash noted. No abrasion, no bruising, no burn, no ecchymosis, no laceration, no lesion, no petechiae and no purpura noted. Rash is macular, papular, maculopapular and pustular. Rash is not nodular, not vesicular and not urticarial. She is not diaphoretic. There is erythema. No cyanosis. No pallor.     Psychiatric: She has a normal mood and affect. Her speech is normal and behavior is normal. Judgment and thought content normal. Cognition and memory are normal.  Nursing note and vitals reviewed.         Assessment & Plan:  A-tick bite initial encounter P-Has been less than 72 hours.  May continue benadryl 25-50mg  po q6-8h prn itching OTC and/or topical benadryl 2% OTC prn to rash.  Avoid scratching to prevent secondary infection skin.  May apply ice 15 minutes prn TID.  doxycycline 200mg  po x1 now with food dispensed from [facility] #20 RF0.  Discussed with  patient at risk for sunburn this week wear protective clothing.  Patient to follow up with PCM or [place] if worsening symptoms or new symptoms for re-evaluation e.g. Fever, lethargy, hematemesis/hematuria/hemoptysis, wheezing, rash.  Patient given exitcare handout on tick disease, cellulitis, [place] spotted fever and lyme disease.  Patient verbalized understanding of information/instructions, agreed with plan of care and had no further questions at this time.

## 2016-12-07 ENCOUNTER — Ambulatory Visit: Payer: Self-pay | Admitting: *Deleted

## 2016-12-07 VITALS — BP 106/71 | HR 84 | Ht 67.0 in | Wt 170.0 lb

## 2016-12-07 DIAGNOSIS — Z0000 Encounter for general adult medical examination without abnormal findings: Secondary | ICD-10-CM

## 2016-12-07 NOTE — Progress Notes (Signed)
[employer] insurance premium discount evaluation: Labs Drawn. Repl[employer] ROI form signed. Tobacco Free Attestation form signed.  Forms placed in paper chart.  No pcp to route results to. 

## 2016-12-08 LAB — CMP12+LP+TP+TSH+6AC+CBC/D/PLT
A/G RATIO: 1.7 (ref 1.2–2.2)
ALK PHOS: 58 IU/L (ref 39–117)
ALT: 10 IU/L (ref 0–32)
AST: 14 IU/L (ref 0–40)
Albumin: 4.1 g/dL (ref 3.5–5.5)
BASOS ABS: 0 10*3/uL (ref 0.0–0.2)
BASOS: 0 %
BILIRUBIN TOTAL: 0.5 mg/dL (ref 0.0–1.2)
BUN / CREAT RATIO: 19 (ref 9–23)
BUN: 11 mg/dL (ref 6–20)
CREATININE: 0.59 mg/dL (ref 0.57–1.00)
Calcium: 8.6 mg/dL — ABNORMAL LOW (ref 8.7–10.2)
Chloride: 101 mmol/L (ref 96–106)
Chol/HDL Ratio: 2.7 ratio (ref 0.0–4.4)
Cholesterol, Total: 146 mg/dL (ref 100–199)
EOS (ABSOLUTE): 0.2 10*3/uL (ref 0.0–0.4)
EOS: 2 %
Free Thyroxine Index: 1.7 (ref 1.2–4.9)
GFR, EST AFRICAN AMERICAN: 137 mL/min/{1.73_m2} (ref >59)
GFR, EST NON AFRICAN AMERICAN: 119 mL/min/{1.73_m2} (ref >59)
GGT: 9 IU/L (ref 0–60)
GLUCOSE: 85 mg/dL (ref 65–99)
Globulin, Total: 2.4 g/dL (ref 1.5–4.5)
HDL: 55 mg/dL (ref >39)
HEMATOCRIT: 37.3 % (ref 34.0–46.6)
HEMOGLOBIN: 12.4 g/dL (ref 11.1–15.9)
IMMATURE GRANULOCYTES: 0 %
Immature Grans (Abs): 0 10*3/uL (ref 0.0–0.1)
Iron: 109 ug/dL (ref 27–159)
LDH: 173 IU/L (ref 119–226)
LDL CALC: 77 mg/dL (ref 0–99)
LYMPHS ABS: 3.1 10*3/uL (ref 0.7–3.1)
Lymphs: 35 %
MCH: 30.3 pg (ref 26.6–33.0)
MCHC: 33.2 g/dL (ref 31.5–35.7)
MCV: 91 fL (ref 79–97)
MONOCYTES: 6 %
Monocytes Absolute: 0.5 10*3/uL (ref 0.1–0.9)
NEUTROS PCT: 57 %
Neutrophils Absolute: 5 10*3/uL (ref 1.4–7.0)
Phosphorus: 3.1 mg/dL (ref 2.5–4.5)
Platelets: 299 10*3/uL (ref 150–379)
Potassium: 4.1 mmol/L (ref 3.5–5.2)
RBC: 4.09 x10E6/uL (ref 3.77–5.28)
RDW: 13.2 % (ref 12.3–15.4)
SODIUM: 137 mmol/L (ref 134–144)
T3 Uptake Ratio: 24 % (ref 24–39)
T4, Total: 7.1 ug/dL (ref 4.5–12.0)
TSH: 1.49 u[IU]/mL (ref 0.450–4.500)
Total Protein: 6.5 g/dL (ref 6.0–8.5)
Triglycerides: 68 mg/dL (ref 0–149)
URIC ACID: 3.7 mg/dL (ref 2.5–7.1)
VLDL CHOLESTEROL CAL: 14 mg/dL (ref 5–40)
WBC: 8.8 10*3/uL (ref 3.4–10.8)

## 2016-12-08 LAB — HGB A1C W/O EAG: Hgb A1c MFr Bld: 5.1 % (ref 4.8–5.6)

## 2016-12-08 NOTE — Progress Notes (Signed)
Results reviewed with pt. Calcium slightly low. Low-normal previously. Dietary sources of calcium discussed. All other labs unremarkable. BMI slightly elevated. Discussed diet and exercise recommendations for wt management. Copy of labs provided to pt. No pcp to route results to. No further questions/concerns.

## 2017-03-13 ENCOUNTER — Ambulatory Visit: Payer: Self-pay | Admitting: Registered Nurse

## 2017-03-13 VITALS — BP 104/72 | HR 68 | Temp 98.9°F

## 2017-03-13 DIAGNOSIS — J00 Acute nasopharyngitis [common cold]: Secondary | ICD-10-CM

## 2017-03-13 DIAGNOSIS — H6983 Other specified disorders of Eustachian tube, bilateral: Secondary | ICD-10-CM

## 2017-03-13 DIAGNOSIS — B309 Viral conjunctivitis, unspecified: Secondary | ICD-10-CM

## 2017-03-13 MED ORDER — FLUTICASONE PROPIONATE 50 MCG/ACT NA SUSP: 1.0000 | Freq: Two times a day (BID) | NASAL | 0 refills | Status: DC

## 2017-03-13 MED ORDER — KETOTIFEN FUMARATE 0.025 % OP SOLN: 1.0000 [drp] | Freq: Two times a day (BID) | OPHTHALMIC | 0 refills | Status: AC

## 2017-03-13 MED ORDER — LORATADINE 10 MG PO TABS: 10.0000 mg | ORAL_TABLET | Freq: Every day | ORAL | 11 refills | Status: DC

## 2017-03-13 MED ORDER — CARBOXYMETHYLCELLULOSE SODIUM 1 % OP SOLN: 1.0000 [drp] | Freq: Three times a day (TID) | OPHTHALMIC | 12 refills | Status: AC

## 2017-03-13 NOTE — Progress Notes (Signed)
Subjective:    Patient ID: Shirley Ryan, female    DOB: [birth year], 36 y.o.   MRN: [id number]  36y/o caucasian female established Pt reports L eye with upper eyelid swelling, pain, itching, drainage. Waking up with eye matted and sticky x2 days yellow to clear.  Watering when at work.  Denied visual changes, headache, orbital swelling, fever, chills.  Left ear pressure/pain also.  "Feels like I have a cold in my eye"  Has tried cool compress, showering.  Stopped claritin for the winter and needs refill on her flonase nasal spray.  Typically seasonal allergies do not bother her in the winter but they are this year.  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         Review of Systems  Constitutional: Negative for activity change, appetite change, chills, diaphoresis, fatigue, fever and unexpected weight change.  HENT: Positive for congestion, ear pain and postnasal drip. Negative for dental problem, drooling, ear discharge, facial swelling, hearing loss, mouth sores, nosebleeds, rhinorrhea, sinus pressure, sinus pain, sneezing, sore throat, tinnitus, trouble swallowing and voice change.   Eyes: Positive for pain, discharge, redness and itching. Negative for photophobia and visual disturbance.  Respiratory: Negative for cough, wheezing and stridor.   Cardiovascular: Negative for chest pain.  Gastrointestinal: Negative for diarrhea, nausea and vomiting.  Endocrine: Negative for cold intolerance and heat intolerance.  Genitourinary: Negative for difficulty urinating, dysuria and hematuria.  Musculoskeletal: Negative for arthralgias, back pain, gait problem, joint swelling, myalgias, neck pain and neck stiffness.  Skin: Negative for color change, pallor, rash and wound.  Allergic/Immunologic: Positive for environmental allergies. Negative for food allergies.  Neurological: Negative for dizziness, tremors, seizures, syncope, facial asymmetry, speech difficulty, weakness,  light-headedness, numbness and headaches.  Hematological: Negative for adenopathy. Does not bruise/bleed easily.  Psychiatric/Behavioral: Negative for agitation, behavioral problems, confusion and sleep disturbance.       Objective:   Physical Exam  Constitutional: She is oriented to person, place, and time. Vital signs are normal. She appears well-developed and well-nourished. She is active and cooperative.  Non-toxic appearance. She does not have a sickly appearance. She appears ill. No distress.  HENT:  Head: Normocephalic and atraumatic.  Right Ear: Hearing, external ear and ear canal normal. A middle ear effusion is present.  Left Ear: Hearing, external ear and ear canal normal. A middle ear effusion is present.  Nose: Mucosal edema and rhinorrhea present. No nose lacerations, sinus tenderness, nasal deformity, septal deviation or nasal septal hematoma. No epistaxis.  No foreign bodies. Right sinus exhibits no maxillary sinus tenderness and no frontal sinus tenderness. Left sinus exhibits no maxillary sinus tenderness and no frontal sinus tenderness.  Mouth/Throat: Uvula is midline and mucous membranes are normal. Mucous membranes are not pale, not dry and not cyanotic. She does not have dentures. No oral lesions. No trismus in the jaw. Normal dentition. No dental abscesses, uvula swelling, lacerations or dental caries. Posterior oropharyngeal edema and posterior oropharyngeal erythema present. No oropharyngeal exudate or tonsillar abscesses.  Cobblestoning posterior pharynx; bilateral TMs air fluid level clear; bilateral allergic shiners; bilateral lower eyelids 0-1+/4 nonpitting edema; bilateral nasal turbinates edema/erythema clear discharge  Eyes: EOM and lids are normal. Pupils are equal, round, and reactive to light. Right eye exhibits no chemosis, no discharge, no exudate and no hordeolum. No foreign body present in the right eye. Left eye exhibits no chemosis, no discharge, no exudate  and no hordeolum. No foreign body present in the left eye. Right  conjunctiva is injected. Right conjunctiva has no hemorrhage. Left conjunctiva is injected. Left conjunctiva has no hemorrhage. No scleral icterus. Right eye exhibits normal extraocular motion and no nystagmus. Left eye exhibits normal extraocular motion and no nystagmus. Right pupil is round and reactive. Left pupil is round and reactive. Pupils are equal.  Left eyelid conjunctiva injected 2+/4 upper and lower; right 1+/4 upper and lower injection;   Neck: Trachea normal, normal range of motion and phonation normal. Neck supple. No tracheal tenderness, no spinous process tenderness and no muscular tenderness present. No neck rigidity. No tracheal deviation, no edema, no erythema and normal range of motion present. No thyroid mass and no thyromegaly present.  Cardiovascular: Normal rate, regular rhythm, S1 normal, S2 normal, normal heart sounds and intact distal pulses. PMI is not displaced. Exam reveals no gallop and no friction rub.  No murmur heard. Pulmonary/Chest: Effort normal and breath sounds normal. No accessory muscle usage or stridor. No respiratory distress. She has no decreased breath sounds. She has no wheezes. She has no rhonchi. She has no rales. She exhibits no tenderness.  No cough observed in exam room; spoke full sentences without difficulty  Abdominal: Soft. She exhibits no distension.  Musculoskeletal: Normal range of motion. She exhibits no edema or tenderness.       Right shoulder: Normal.       Left shoulder: Normal.       Right hip: Normal.       Left hip: Normal.       Right knee: Normal.       Left knee: Normal.       Cervical back: Normal.       Right hand: Normal.       Left hand: Normal.  Lymphadenopathy:       Head (right side): No submental, no submandibular, no tonsillar, no preauricular, no posterior auricular and no occipital adenopathy present.       Head (left side): No submental, no  submandibular, no tonsillar, no preauricular, no posterior auricular and no occipital adenopathy present.    She has no cervical adenopathy.       Right cervical: No superficial cervical, no deep cervical and no posterior cervical adenopathy present.      Left cervical: No superficial cervical, no deep cervical and no posterior cervical adenopathy present.  Neurological: She is alert and oriented to person, place, and time. She has normal strength. She is not disoriented. She displays no atrophy and no tremor. No cranial nerve deficit or sensory deficit. She exhibits normal muscle tone. She displays no seizure activity. Coordination and gait normal. GCS eye subscore is 4. GCS verbal subscore is 5. GCS motor subscore is 6.  On/off exam table; in/out of chair without difficulty; gait sure and steady in hallway  Skin: Skin is warm, dry and intact. No abrasion, no bruising, no burn, no ecchymosis, no laceration, no lesion, no petechiae and no rash noted. She is not diaphoretic. No cyanosis or erythema. No pallor. Nails show no clubbing.  Psychiatric: She has a normal mood and affect. Her speech is normal and behavior is normal. Judgment and thought content normal. Cognition and memory are normal.  Nursing note and vitals reviewed.         Assessment & CapriceMerlinda Frederickral conjuncCapriceMerlind<MEASUREM  Electronic Rx claritin 10mg  po daily #30 [id number].  Patient denied personal or family history of ENT cancer.  OTC antihistamine of choice claritin/zyrtec 10mg  po daily.  Avoid triggers if possible.  Shower prior to bedtime if exposed to triggers.  If allergic dust/dust mites recommend mattress/pillow covers/encasements; washing linens, vacuuming, sweeping, dusting weekly.  Call or return to clinic as needed if these symptoms worsen  or fail to improve as anticipated.   Exitcare handout on acute rhinitis and sinus rinse given to patient.  Patient verbalized understanding of instructions, agreed with plan of care and had no further questions at this time.   Electronic Rx to patient pharmacy of choice Ketotifen 0.25% ophthalmic 1gtt ou BID #1 RF0  Refresh 2gtts ou TID prn itching/dryness/irritation #1 RF11 and 5 UD given to patient from clinic stock electronic Rx and given 5 UD from clinic stock.  Hygiene discussed. Does not wear contacts or glasses Patient to apply warm packs prn as directed.  Instructed patient to not rub eyes.  Wash hands before and after eye drop administration and do not touch bottle to eye/eyelashes.  May need to wash pillowcases more frequently until infection resolves.  May use over the counter eye drops/tears for pain/symptom relief.  Return to clinic if headache, fever greater than 100.57F, nausea/vomiting, purulent discharge/matting unable to open eye without using fingers, foreign body sensation, ciliary flush, worsening photophobia or vision.  Call or return to clinic as needed if these symptoms worsen or fail to improve as anticipated.  Patient given Exitcare handout on viral conjunctivitis.  Patient verbalized agreement and understanding of treatment plan and had no further questions at this time.   P2:  Hand washing.P2:  Avoidance and hand washing.  Supportive treatment.   No evidence of invasive bacterial infection, non toxic and well hydrated.  This is most likely self limiting viral infection.  I do not see where any further testing or imaging is necessary at this time.   I will suggest supportive care, rest, good hygiene and encourage the patient to take adequate fluids.  The patient is to return to clinic or EMERGENCY ROOM if symptoms worsen or change significantly e.g. ear pain, fever, purulent discharge from ears or bleeding.  Exitcare handout on eustachian tube dysfunction given to patient.  Patient  verbalized agreement and understanding of treatment plan and had no further questions at this time.

## 2017-03-13 NOTE — Patient Instructions (Signed)
Viral Conjunctivitis, Adult Viral conjunctivitis is an inflammation of the clear membrane that covers the white part of your eye and the inner surface of your eyelid (conjunctiva). The inflammation is caused by a viral infection. The blood vessels in the conjunctiva become inflamed, causing the eye to become red or pink, and often itchy. Viral conjunctivitis can be easily passed from one person to another (is contagious). This condition is often called pink eye. What are the causes? This condition is caused by a virus. A virus is a type of contagious germ. It can be spread by touching objects that have been contaminated with the virus, such as doorknobs or towels. It can also be passed through droplets, such as from coughing or sneezing. What are the signs or symptoms? Symptoms of this condition include:  Eye redness.  Tearing or watery eyes.  Itchy and irritated eyes.  Burning feeling in the eyes.  Clear drainage from the eye.  Swollen eyelids.  A gritty feeling in the eye.  Light sensitivity.  This condition often occurs with other symptoms, such as a fever, nausea, or a rash. How is this diagnosed? This condition is diagnosed with a medical history and physical exam. If you have discharge from your eye, the discharge may be tested to rule out other causes of conjunctivitis. How is this treated? Viral conjunctivitis does not respond to medicines that kill bacteria (antibiotics). Treatment for viral conjunctivitis is directed at stopping a bacterial infection from developing in addition to the viral infection. Treatment also aims to relieve your symptoms, such as itching. This may be done with antihistamine drops or other eye medicines. Rarely, steroid eye drops or antiviral medicines may be prescribed. Follow these instructions at home: Medicines   Take or apply over-the-counter and prescription medicines only as told by your health care provider.  Be very careful to avoid  touching the edge of the eyelid with the eye drop bottle or ointment tube when applying medicines to the affected eye. Being careful this way will stop you from spreading the infection to the other eye or to other people. Eye care  Avoid touching or rubbing your eyes.  Apply a warm, wet, clean washcloth to your eye for 10-20 minutes, 3-4 times per day or as told by your health care provider.  If you wear contact lenses, do not wear them until the inflammation is gone and your health care provider says it is safe to wear them again. Ask your health care provider how to sterilize or replace your contact lenses before using them again. Wear glasses until you can resume wearing contacts.  Avoid wearing eye makeup until the inflammation is gone. Throw away any old eye cosmetics that may be contaminated.  Gently wipe away any drainage from your eye with a warm, wet washcloth or a cotton ball. General instructions  Change or wash your pillowcase every day or as told by your health care provider.  Do not share towels, pillowcases, washcloths, eye makeup, makeup brushes, contact lenses, or glasses. This may spread the infection.  Wash your hands often with soap and water. Use paper towels to dry your hands. If soap and water are not available, use hand sanitizer.  Try to avoid contact with other people for one week or as told by your health care provider. Contact a health care provider if:  Your symptoms do not improve with treatment or they get worse.  You have increased pain.  Your vision becomes blurry.  You have a  fever.  You have facial pain, redness, or swelling.  You have yellow or green drainage coming from your eye.  You have new symptoms. This information is not intended to replace advice given to you by your health care provider. Make sure you discuss any questions you have with your health care provider. Document Released: 06/17/2002 Document Revised: 10/23/2015 Document  Reviewed: 10/12/2015 Elsevier Interactive Patient Education  2017 [employer] Eustachian Tube Dysfunction The eustachian tube connects the middle ear to the back of the nose. It regulates air pressure in the middle ear by allowing air to move between the ear and nose. It also helps to drain fluid from the middle ear space. When the eustachian tube does not function properly, air pressure, fluid, or both can build up in the middle ear. Eustachian tube dysfunction can affect one or both ears. What are the causes? This condition happens when the eustachian tube becomes blocked or cannot open normally. This may result from:  Ear infections.  Colds and other upper respiratory infections.  Allergies.  Irritation, such as from cigarette smoke or acid from the stomach coming up into the esophagus (gastroesophageal reflux).  Sudden changes in air pressure, such as from descending in an airplane.  Abnormal growths in the nose or throat, such as nasal polyps, tumors, or enlarged tissue at the back of the throat (adenoids).  What increases the risk? This condition may be more likely to develop in people who smoke and people who are overweight. Eustachian tube dysfunction may also be more likely to develop in children, especially children who have:  Certain birth defects of the mouth, such as cleft palate.  Large tonsils and adenoids.  What are the signs or symptoms? Symptoms of this condition may include:  A feeling of fullness in the ear.  Ear pain.  Clicking or popping noises in the ear.  Ringing in the ear.  Hearing loss.  Loss of balance.  Symptoms may get worse when the air pressure around you changes, such as when you travel to an area of high elevation or fly on an airplane. How is this diagnosed? This condition may be diagnosed based on:  Your symptoms.  A physical exam of your ear, nose, and throat.  Tests, such as those that measure: ? The movement of your eardrum  (tympanogram). ? Your hearing (audiometry).  How is this treated? Treatment depends on the cause and severity of your condition. If your symptoms are mild, you may be able to relieve your symptoms by moving air into ("popping") your ears. If you have symptoms of fluid in your ears, treatment may include:  Decongestants.  Antihistamines.  Nasal sprays or ear drops that contain medicines that reduce swelling (steroids).  In some cases, you may need to have a procedure to drain the fluid in your eardrum (myringotomy). In this procedure, a small tube is placed in the eardrum to:  Drain the fluid.  Restore the air in the middle ear space.  Follow these instructions at home:  Take over-the-counter and prescription medicines only as told by your health care provider.  Use techniques to help pop your ears as recommended by your health care provider. These may include: ? Chewing gum. ? Yawning. ? Frequent, forceful swallowing. ? Closing your mouth, holding your nose closed, and gently blowing as if you are trying to blow air out of your nose.  Do not do any of the following until your health care provider approves: ? Travel to high altitudes. ?  Fly in airplanes. ? Work in a Estate agentpressurized cabin or room. ? Scuba dive.  Keep your ears dry. Dry your ears completely after showering or bathing.  Do not smoke.  Keep all follow-up visits as told by your health care provider. This is important. Contact a health care provider if:  Your symptoms do not go away after treatment.  Your symptoms come back after treatment.  You are unable to pop your ears.  You have: ? A fever. ? Pain in your ear. ? Pain in your head or neck. ? Fluid draining from your ear.  Your hearing suddenly changes.  You become very dizzy.  You lose your balance. This information is not intended to replace advice given to you by your health care provider. Make sure you discuss any questions you have with your  health care provider. Document Released: 04/23/2015 Document Revised: 09/02/2015 Document Reviewed: 04/15/2014 Elsevier Interactive Patient Education  [employer]2018 [employer]

## 2017-04-19 ENCOUNTER — Ambulatory Visit: Payer: Self-pay | Admitting: Registered Nurse

## 2017-04-19 VITALS — BP 116/82 | HR 90 | Temp 99.6°F

## 2017-04-19 DIAGNOSIS — J0101 Acute recurrent maxillary sinusitis: Secondary | ICD-10-CM

## 2017-04-19 DIAGNOSIS — H65193 Other acute nonsuppurative otitis media, bilateral: Secondary | ICD-10-CM

## 2017-04-19 MED ORDER — SALINE SPRAY 0.65 % NA SOLN: 2.0000 | NASAL | 0 refills | Status: DC

## 2017-04-19 MED ORDER — AMOXICILLIN 875 MG PO TABS: 875.0000 mg | ORAL_TABLET | Freq: Two times a day (BID) | ORAL | 0 refills | Status: DC

## 2017-04-19 MED ORDER — ACETAMINOPHEN 500 MG PO TABS: 1000.0000 mg | ORAL_TABLET | Freq: Four times a day (QID) | ORAL | 0 refills | Status: AC | PRN

## 2017-04-19 MED ORDER — PHENYLEPHRINE HCL 5 MG PO TABS: 5.0000 mg | ORAL_TABLET | Freq: Four times a day (QID) | ORAL | 0 refills | Status: AC | PRN

## 2017-04-19 NOTE — Patient Instructions (Signed)
Sinusitis, Adult Sinusitis is soreness and inflammation of your sinuses. Sinuses are hollow spaces in the bones around your face. Your sinuses are located:  Around your eyes.  In the middle of your forehead.  Behind your nose.  In your cheekbones.  Your sinuses and nasal passages are lined with a stringy fluid (mucus). Mucus normally drains out of your sinuses. When your nasal tissues become inflamed or swollen, the mucus can become trapped or blocked so air cannot flow through your sinuses. This allows bacteria, viruses, and funguses to grow, which leads to infection. Sinusitis can develop quickly and last for 7?10 days (acute) or for more than 12 weeks (chronic). Sinusitis often develops after a cold. What are the causes? This condition is caused by anything that creates swelling in the sinuses or stops mucus from draining, including:  Allergies.  Asthma.  Bacterial or viral infection.  Abnormally shaped bones between the nasal passages.  Nasal growths that contain mucus (nasal polyps).  Narrow sinus openings.  Pollutants, such as chemicals or irritants in the air.  A foreign object stuck in the nose.  A fungal infection. This is rare.  What increases the risk? The following factors may make you more likely to develop this condition:  Having allergies or asthma.  Having had a recent cold or respiratory tract infection.  Having structural deformities or blockages in your nose or sinuses.  Having a weak immune system.  Doing a lot of swimming or diving.  Overusing nasal sprays.  Smoking.  What are the signs or symptoms? The main symptoms of this condition are pain and a feeling of pressure around the affected sinuses. Other symptoms include:  Upper toothache.  Earache.  Headache.  Bad breath.  Decreased sense of smell and taste.  A cough that may get worse at night.  Fatigue.  Fever.  Thick drainage from your nose. The drainage is often green and  it may contain pus (purulent).  Stuffy nose or congestion.  Postnasal drip. This is when extra mucus collects in the throat or back of the nose.  Swelling and warmth over the affected sinuses.  Sore throat.  Sensitivity to light.  How is this diagnosed? This condition is diagnosed based on symptoms, a medical history, and a physical exam. To find out if your condition is acute or chronic, your health care provider may:  Look in your nose for signs of nasal polyps.  Tap over the affected sinus to check for signs of infection.  View the inside of your sinuses using an imaging device that has a light attached (endoscope).  If your health care provider suspects that you have chronic sinusitis, you may also:  Be tested for allergies.  Have a sample of mucus taken from your nose (nasal culture) and checked for bacteria.  Have a mucus sample examined to see if your sinusitis is related to an allergy.  If your sinusitis does not respond to treatment and it lasts longer than 8 weeks, you may have an MRI or CT scan to check your sinuses. These scans also help to determine how severe your infection is. In rare cases, a bone biopsy may be done to rule out more serious types of fungal sinus disease. How is this treated? Treatment for sinusitis depends on the cause and whether your condition is chronic or acute. If a virus is causing your sinusitis, your symptoms will go away on their own within 10 days. You may be given medicines to relieve your symptoms,   including:  Topical nasal decongestants. They shrink swollen nasal passages and let mucus drain from your sinuses.  Antihistamines. These drugs block inflammation that is triggered by allergies. This can help to ease swelling in your nose and sinuses.  Topical nasal corticosteroids. These are nasal sprays that ease inflammation and swelling in your nose and sinuses.  Nasal saline washes. These rinses can help to get rid of thick mucus in  your nose.  If your condition is caused by bacteria, you will be given an antibiotic medicine. If your condition is caused by a fungus, you will be given an antifungal medicine. Surgery may be needed to correct underlying conditions, such as narrow nasal passages. Surgery may also be needed to remove polyps. Follow these instructions at home: Medicines  Take, use, or apply over-the-counter and prescription medicines only as told by your health care provider. These may include nasal sprays.  If you were prescribed an antibiotic medicine, take it as told by your health care provider. Do not stop taking the antibiotic even if you start to feel better. Hydrate and Humidify  Drink enough water to keep your urine clear or pale yellow. Staying hydrated will help to thin your mucus.  Use a cool mist humidifier to keep the humidity level in your home above 50%.  Inhale steam for 10-15 minutes, 3-4 times a day or as told by your health care provider. You can do this in the bathroom while a hot shower is running.  Limit your exposure to cool or dry air. Rest  Rest as much as possible.  Sleep with your head raised (elevated).  Make sure to get enough sleep each night. General instructions  Apply a warm, moist washcloth to your face 3-4 times a day or as told by your health care provider. This will help with discomfort.  Wash your hands often with soap and water to reduce your exposure to viruses and other germs. If soap and water are not available, use hand sanitizer.  Do not smoke. Avoid being around people who are smoking (secondhand smoke).  Keep all follow-up visits as told by your health care provider. This is important. Contact a health care provider if:  You have a fever.  Your symptoms get worse.  Your symptoms do not improve within 10 days. Get help right away if:  You have a severe headache.  You have persistent vomiting.  You have pain or swelling around your face or  eyes.  You have vision problems.  You develop confusion.  Your neck is stiff.  You have trouble breathing. This information is not intended to replace advice given to you by your health care provider. Make sure you discuss any questions you have with your health care provider. Document Released: 03/27/2005 Document Revised: 11/21/2015 Document Reviewed: 01/20/2015 Elsevier Interactive Patient Education  2018 [employer] Sinus Rinse What is a sinus rinse? A sinus rinse is a simple home treatment that is used to rinse your sinuses with a sterile mixture of salt and water (saline solution). Sinuses are air-filled spaces in your skull behind the bones of your face and forehead that open into your nasal cavity. You will use the following:  Saline solution.  Neti pot or spray bottle. This releases the saline solution into your nose and through your sinuses. Neti pots and spray bottles can be purchased at your local pharmacy, a health food store, or online.  When would I do a sinus rinse? A sinus rinse can help to clear   mucus, dirt, dust, or pollen from the nasal cavity. You may do a sinus rinse when you have a cold, a virus, nasal allergy symptoms, a sinus infection, or stuffiness in the nose or sinuses. If you are considering a sinus rinse:  Ask your child's health care provider before performing a sinus rinse on your child.  Do not do a sinus rinse if you have had ear or nasal surgery, ear infection, or blocked ears.  How do I do a sinus rinse?  Wash your hands.  Disinfect your device according to the directions provided and then dry it.  Use the solution that comes with your device or one that is sold separately in stores. Follow the mixing directions on the package.  Fill your device with the amount of saline solution as directed by the device instructions.  Stand over a sink and tilt your head sideways over the sink.  Place the spout of the device in your upper nostril (the  one closer to the ceiling).  Gently pour or squeeze the saline solution into the nasal cavity. The liquid should drain to the lower nostril if you are not overly congested.  Gently blow your nose. Blowing too hard may cause ear pain.  Repeat in the other nostril.  Clean and rinse your device with clean water and then air-dry it. Are there risks of a sinus rinse? Sinus rinse is generally very safe and effective. However, there are a few risks, which include:  A burning sensation in the sinuses. This may happen if you do not make the saline solution as directed. Make sure to follow all directions when making the saline solution.  Infection from contaminated water. This is rare, but possible.  Nasal irritation.  This information is not intended to replace advice given to you by your health care provider. Make sure you discuss any questions you have with your health care provider. Document Released: 10/22/2013 Document Revised: 02/22/2016 Document Reviewed: 08/12/2013 Elsevier Interactive Patient Education  2017 [employer] Otitis Media, Adult Otitis media occurs when there is inflammation and fluid in the middle ear. Your middle ear is a part of the ear that contains bones for hearing as well as air that helps send sounds to your brain. What are the causes? This condition is caused by a blockage in the eustachian tube. This tube drains fluid from the ear to the back of the nose (nasopharynx). A blockage in this tube can be caused by an object or by swelling (edema) in the tube. Problems that can cause a blockage include:  A cold or other upper respiratory infection.  Allergies.  An irritant, such as tobacco smoke.  Enlarged adenoids. The adenoids are areas of soft tissue located high in the back of the throat, behind the nose and the roof of the mouth.  A mass in the nasopharynx.  Damage to the ear caused by pressure changes (barotrauma).  What are the signs or  symptoms? Symptoms of this condition include:  Ear pain.  A fever.  Decreased hearing.  A headache.  Tiredness (lethargy).  Fluid leaking from the ear.  Ringing in the ear.  How is this diagnosed? This condition is diagnosed with a physical exam. During the exam your health care provider will use an instrument called an otoscope to look into your ear and check for redness, swelling, and fluid. He or she will also ask about your symptoms. Your health care provider may also order tests, such as:  A test to   check the movement of the eardrum (pneumatic otoscopy). This test is done by squeezing a small amount of air into the ear.  A test that changes air pressure in the middle ear to check how well the eardrum moves and whether the eustachian tube is working (tympanogram).  How is this treated? This condition usually goes away on its own within 3-5 days. But if the condition is caused by a bacteria infection and does not go away own its own, or keeps coming back, your health care provider may:  Prescribe antibiotic medicines to treat the infection.  Prescribe or recommend medicines to control pain.  Follow these instructions at home:  Take over-the-counter and prescription medicines only as told by your health care provider.  If you were prescribed an antibiotic medicine, take it as told by your health care provider. Do not stop taking the antibiotic even if you start to feel better.  Keep all follow-up visits as told by your health care provider. This is important. Contact a health care provider if:  You have bleeding from your nose.  There is a lump on your neck.  You are not getting better in 5 days.  You feel worse instead of better. Get help right away if:  You have severe pain that is not controlled with medicine.  You have swelling, redness, or pain around your ear.  You have stiffness in your neck.  A part of your face is paralyzed.  The bone behind your  ear (mastoid) is tender when you touch it.  You develop a severe headache. Summary  Otitis media is redness, soreness, and swelling of the middle ear.  This condition usually goes away on its own within 3-5 days.  If the problem does not go away in 3-5 days, your health care provider may prescribe or recommend medicines to treat your symptoms.  If you were prescribed an antibiotic medicine, take it as told by your health care provider. This information is not intended to replace advice given to you by your health care provider. Make sure you discuss any questions you have with your health care provider. Document Released: 12/31/2003 Document Revised: 03/17/2016 Document Reviewed: 03/17/2016 Elsevier Interactive Patient Education  2018 [employer]  

## 2017-04-19 NOTE — Progress Notes (Signed)
Subjective:    Patient ID: Shirley Ryan, female    DOB: [birth year], 37 y.o.   MRN: [id number]  36y/o caucaisan female established Pt reports sore throat and R ear pain x3 days. Was seen [facility] 3 days ago and told she had a viral infection (possibly strep, rapid strep negative) and fluid in R ear. Given ear drops they are making her ears burn and hurt worse. Now with continued sore throat and productive cough and L ear pain with muffled hearing. Reports fever of 101 last night. Denies ear drainage. Has tried Sudafed, Benadryl in addition to the ear drops and her daily flonase and claritin.  Having a hard time instilling nose sprays because of nares swelling liquid just runs back out of nares down her lip; + sick contacts home and work      Review of Systems  Constitutional: Positive for chills and fever. Negative for activity change, appetite change, diaphoresis, fatigue and unexpected weight change.  HENT: Positive for congestion, ear pain, postnasal drip, rhinorrhea, sinus pressure, sinus pain and sore throat. Negative for dental problem, drooling, ear discharge, facial swelling, hearing loss, mouth sores, nosebleeds, sneezing, tinnitus, trouble swallowing and voice change.   Eyes: Negative for photophobia, pain, discharge, redness, itching and visual disturbance.  Respiratory: Positive for cough. Negative for choking, chest tightness, shortness of breath, wheezing and stridor.   Cardiovascular: Negative for chest pain, palpitations and leg swelling.  Gastrointestinal: Negative for abdominal distention, abdominal pain, blood in stool, constipation, diarrhea, nausea and vomiting.  Endocrine: Negative for cold intolerance and heat intolerance.  Genitourinary: Negative for difficulty urinating, dysuria and hematuria.  Musculoskeletal: Positive for myalgias. Negative for arthralgias, back pain, gait problem, joint swelling, neck pain and neck stiffness.  Skin: Negative for color change, pallor,  rash and wound.  Allergic/Immunologic: Positive for environmental allergies. Negative for food allergies.  Neurological: Positive for headaches. Negative for dizziness, tremors, seizures, syncope, facial asymmetry, speech difficulty, weakness, light-headedness and numbness.  Hematological: Negative for adenopathy. Does not bruise/bleed easily.  Psychiatric/Behavioral: Negative for agitation, behavioral problems, confusion and sleep disturbance.       Objective:   Physical Exam  Constitutional: She is oriented to person, place, and time. Vital signs are normal. She appears well-developed and well-nourished. She is active and cooperative.  Non-toxic appearance. She does not have a sickly appearance. She appears ill. No distress.  HENT:  Head: Normocephalic and atraumatic.  Right Ear: Hearing, external ear and ear canal normal. Tympanic membrane is erythematous and bulging. A middle ear effusion is present.  Left Ear: Hearing, external ear and ear canal normal. Tympanic membrane is erythematous and bulging. A middle ear effusion is present.  Nose: Mucosal edema and rhinorrhea present. No nose lacerations, sinus tenderness, nasal deformity, septal deviation or nasal septal hematoma. No epistaxis.  No foreign bodies. Right sinus exhibits maxillary sinus tenderness. Right sinus exhibits no frontal sinus tenderness. Left sinus exhibits maxillary sinus tenderness. Left sinus exhibits no frontal sinus tenderness.  Mouth/Throat: Uvula is midline and mucous membranes are normal. Mucous membranes are not pale, not dry and not cyanotic. She does not have dentures. No oral lesions. No trismus in the jaw. Normal dentition. No dental abscesses, uvula swelling, lacerations or dental caries. Posterior oropharyngeal edema and posterior oropharyngeal erythema present. No oropharyngeal exudate or tonsillar abscesses.  Cobblestoning posterior pharynx; bilateral TMs bulging, erythematous 90% surface area fluid clear;  bilateral allergic shiners; hoarse voice; bilateral nasal turbinates edema/erythema/clear discharge  Eyes: Conjunctivae, EOM and lids  are normal. Pupils are equal, round, and reactive to light. Right eye exhibits no chemosis, no discharge, no exudate and no hordeolum. No foreign body present in the right eye. Left eye exhibits no chemosis, no discharge, no exudate and no hordeolum. No foreign body present in the left eye. Right conjunctiva is not injected. Right conjunctiva has no hemorrhage. Left conjunctiva is not injected. Left conjunctiva has no hemorrhage. No scleral icterus. Right eye exhibits normal extraocular motion and no nystagmus. Left eye exhibits normal extraocular motion and no nystagmus. Right pupil is round and reactive. Left pupil is round and reactive. Pupils are equal.  Neck: Trachea normal, normal range of motion and phonation normal. Neck supple. No tracheal tenderness and no muscular tenderness present. No neck rigidity. No tracheal deviation, no edema, no erythema and normal range of motion present. No thyroid mass and no thyromegaly present.  Cardiovascular: Normal rate, regular rhythm, S1 normal, S2 normal, normal heart sounds and intact distal pulses. PMI is not displaced. Exam reveals no gallop and no friction rub.  No murmur heard. Pulmonary/Chest: Effort normal and breath sounds normal. No accessory muscle usage or stridor. No respiratory distress. She has no decreased breath sounds. She has no wheezes. She has no rhonchi. She has no rales. She exhibits no tenderness.  No cough observed in exam room; spoke full sentences without difficulty  Abdominal: Soft. Normal appearance. She exhibits no distension, no fluid wave and no ascites. There is no rigidity and no guarding.  Musculoskeletal: She exhibits no edema or tenderness.       Right shoulder: Normal.       Left shoulder: Normal.       Right elbow: Normal.      Left elbow: Normal.       Right hip: Normal.       Left hip:  Normal.       Right knee: Normal.       Left knee: Normal.       Cervical back: Normal.       Thoracic back: Normal.       Lumbar back: Normal.       Right hand: Normal.       Left hand: Normal.  Lymphadenopathy:       Head (right side): No submental, no submandibular, no tonsillar, no preauricular, no posterior auricular and no occipital adenopathy present.       Head (left side): No submental, no submandibular, no tonsillar, no preauricular, no posterior auricular and no occipital adenopathy present.    She has no cervical adenopathy.       Right cervical: No superficial cervical, no deep cervical and no posterior cervical adenopathy present.      Left cervical: No superficial cervical, no deep cervical and no posterior cervical adenopathy present.  Neurological: She is alert and oriented to person, place, and time. She has normal strength. She is not disoriented. She displays no atrophy and no tremor. No cranial nerve deficit or sensory deficit. She exhibits normal muscle tone. She displays no seizure activity. Coordination and gait normal. GCS eye subscore is 4. GCS verbal subscore is 5. GCS motor subscore is 6.  On/off exam table; in/out of chair without difficulty; gait sure and steady in hallway  Skin: Skin is warm, dry and intact. No abrasion, no bruising, no burn, no ecchymosis, no laceration, no lesion, no petechiae and no rash noted. She is not diaphoretic. No cyanosis or erythema. No pallor. Nails show no clubbing.  Psychiatric: She  has a normal mood and affect. Her speech is normal and behavior is normal. Judgment and thought content normal. Cognition and memory are normal.  Nursing note and vitals reviewed.         Assessment & Plan:  A-bilateral otitis media acute nonsupportive; maxillary sinusitis recurrent acute P-Stop ear drops start amoxicillin 875mg  po BID x10 days #20 RF0 dispensed from [facility].Supportive treatment.   No evidence of invasive bacterial infection, non toxic  and well hydrated.  This is most likely self limiting viral infection.  I do not see where any further testing or imaging is necessary at this time.   I will suggest supportive care, rest, good hygiene and encourage the patient to take adequate fluids.  The patient is to return to clinic or EMERGENCY ROOM if symptoms worsen or change significantly e.g. ear pain, fever, purulent discharge from ears or bleeding.  Exitcare handout on otitis media given to patient.  Patient verbalized agreement and understanding of treatment plan.      Phenylephrine 5mg  po q6h prn rhinitis 4 UD and tylenol 1000mg  po QID prn pain 8 UD given to patient continue flonase and nasal saline 1 bottle given to patient from clinic stock. flonase 1 spray each nostril BID #1 RF6 electronic Rx, saline 2 sprays each nostril q2h wa prn congestion 1 bottle given from clinic stock.  Denied personal or family history of ENT cancer.  Shower BID especially prior to bed. No evidence of systemic bacterial infection, non toxic and well hydrated.  I do not see where any further testing or imaging is necessary at this time.   I will suggest supportive care, rest, good hygiene and encourage the patient to take adequate fluids.  The patient is to return to clinic or EMERGENCY ROOM if symptoms worsen or change significantly.  Exitcare handout on sinusitis and sinus rinse given to patient.  Patient verbalized agreement and understanding of treatment plan and had no further questions at this time.   P2:  Hand washing and cover cough

## 2017-06-14 ENCOUNTER — Ambulatory Visit: Payer: Self-pay | Admitting: Registered Nurse

## 2017-06-14 VITALS — BP 111/69 | HR 65 | Temp 98.8°F

## 2017-06-14 DIAGNOSIS — IMO0001 Reserved for inherently not codable concepts without codable children: Secondary | ICD-10-CM

## 2017-06-14 DIAGNOSIS — S93402A Sprain of unspecified ligament of left ankle, initial encounter: Secondary | ICD-10-CM

## 2017-06-14 MED ORDER — ACETAMINOPHEN 500 MG PO TABS: 1000.0000 mg | ORAL_TABLET | Freq: Four times a day (QID) | ORAL | 0 refills | Status: AC | PRN

## 2017-06-14 NOTE — Patient Instructions (Signed)
Ankle Sprain -An ankle sprain is a stretch or tear in one of the tough, fiber-like tissues (ligaments) in the ankle. The ligaments in your ankle help to hold the bones of the ankle together. What are the causes? This condition is often caused by stepping on or falling on the outer edge of the foot. What increases the risk? This condition is more likely to develop in people who play sports. What are the signs or symptoms? Symptoms of this condition include:  Pain in your ankle.  Swelling.  Bruising. Bruising may develop right after you sprain your ankle or 1-2 days later.  Trouble standing or walking, especially when you turn or change directions.  How is this diagnosed? This condition is diagnosed with a physical exam. During the exam, your health care provider will press on certain parts of your foot and ankle and try to move them in certain ways. X-rays may be taken to see how severe the sprain is and to check for broken bones. How is this treated? This condition may be treated with:  A brace. This is used to keep the ankle from moving until it heals.  An elastic bandage. This is used to support the ankle.  Crutches.  Pain medicine.  Surgery. This may be needed if the sprain is severe.  Physical therapy. This may help to improve the range of motion in the ankle.  Follow these instructions at home:  Rest your ankle.  Take over-the-counter and prescription medicines only as told by your health care provider.  For 2-3 days, keep your ankle raised (elevated) above the level of your heart as much as possible.  If directed, apply ice to the area: ? Put ice in a plastic bag. ? Place a towel between your skin and the bag. ? Leave the ice on for 20 minutes, 2-3 times a day.  If you were given a brace: ? Wear it as directed. ? Remove it to shower or bathe. ? Try not to move your ankle much, but wiggle your toes from time to time. This helps to prevent swelling.  If you  were given an elastic bandage (dressing): ? Remove it to shower or bathe. ? Try not to move your ankle much, but wiggle your toes from time to time. This helps to prevent swelling. ? Adjust the dressing to make it more comfortable if it feels too tight. ? Loosen the dressing if you have numbness or tingling in your foot, or if your foot becomes cold and blue.  -If you have crutches, use them as told by your health care provider. Continue to use them until you can walk without feeling pain in your ankle. Contact a health care provider if:  You have rapidly increasing bruising or swelling.  Your pain is not relieved with medicine. Get help right away if:  Your toes or foot becomes numb or blue.  You have severe pain that gets worse. This information is not intended to replace advice given to you by your health care provider. Make sure you discuss any questions you have with your health care provider. Document Released: 03/27/2005 Document Revised: 05/04/2016 Document Reviewed: 10/27/2014 Elsevier Interactive Patient Education  2018 [employer]   Ankle Sprain, Phase I Rehab Ask your health care provider which exercises are safe for you. Do exercises exactly as told by your health care provider and adjust them as directed. It is normal to feel mild stretching, pulling, tightness, or discomfort as you do these exercises, but   you should stop right away if you feel sudden pain or your pain gets worse.Do not begin these exercises until told by your health care provider. Stretching and range of motion exercises These exercises warm up your muscles and joints and improve the movement and flexibility of your lower leg and ankle. These exercises also help to relieve pain and stiffness. Exercise A: Gastroc and soleus stretch - 1. Sit on the floor with your left / right leg extended. 2. Loop a belt or towel around the ball of your left / right foot. The ball of your foot is on the walking surface,  right under your toes. 3. Keep your left / right ankle and foot relaxed and keep your knee straight while you use the belt or towel to pull your foot toward you. You should feel a gentle stretch behind your calf or knee. 4. Hold this position for __________ seconds, then release to the starting position. Repeat the exercise with your knee bent. You can put a pillow or a rolled bath towel under your knee to support it. You should feel a stretch deep in your calf or at your Achilles tendon. Repeat each stretch __________ times. Complete these stretches __________ times a day. Exercise B: Ankle alphabet - 1. Sit with your left / right leg supported at the lower leg. ? Do not rest your foot on anything. ? Make sure your foot has room to move freely. 2. Think of your left / right foot as a paintbrush, and move your foot to trace each letter of the alphabet in the air. Keep your hip and knee still while you trace. Make the letters as large as you can without feeling discomfort. 3. Trace every letter from A to Z. Repeat __________ times. Complete this exercise __________ times a day. Strengthening exercises These exercises build strength and endurance in your ankle and lower leg. Endurance is the ability to use your muscles for a long time, even after they get tired. Exercise C: Dorsiflexors - 1. Secure a rubber exercise band or tube to an object, such as a table leg, that will stay still when the band is pulled. Secure the other end around your left / right foot. 2. Sit on the floor facing the object, with your left / right leg extended. The band or tube should be slightly tense when your foot is relaxed. 3. Slowly bring your foot toward you, pulling the band tighter. 4. Hold this position for __________ seconds. 5. Slowly return your foot to the starting position. Repeat __________ times. Complete this exercise __________ times a day. Exercise D: Plantar flexors - 1. Sit on the floor with your  left / right leg extended. 2. Loop a rubber exercise tube or band around the ball of your left / right foot. The ball of your foot is on the walking surface, right under your toes. ? Hold the ends of the band or tube in your hands. ? The band or tube should be slightly tense when your foot is relaxed. 3. Slowly point your foot and toes downward, pushing them away from you. 4. Hold this position for __________ seconds. 5. Slowly return your foot to the starting position. Repeat __________ times. Complete this exercise __________ times a day. Exercise E: Evertors 1. Sit on the floor with your legs straight out in front of you. 2. Loop a rubber exercise band or tube around the ball of your left / right foot. The ball of your foot is on   the walking surface, right under your toes. ? Hold the ends of the band in your hands, or secure the band to a stable object. ? The band or tube should be slightly tense when your foot is relaxed. 3. Slowly push your foot outward, away from your other leg. 4. Hold this position for __________ seconds. 5. Slowly return your foot to the starting position. Repeat __________ times. Complete this exercise __________ times a day. This information is not intended to replace advice given to you by your health care provider. Make sure you discuss any questions you have with your health care provider. Document Released: 10/26/2004 Document Revised: 12/02/2015 Document Reviewed: 02/08/2015 Elsevier Interactive Patient Education  2018 [employer]  

## 2017-06-14 NOTE — Progress Notes (Signed)
Subjective:    Patient ID: Shirley Ryan, female    DOB: [birth year], 37 y.o.   MRN: [id number]  37y/o caucasian female established Pt c/o L lateral ankle pain x6 days. No known acute injury. No hx of injury to ankle. RN saw pt for same on 06/12/17. Mild swelling was present then. No erythema, bruising, or obvious deformity noted at that time. ACE wrap was placed with instructions for RICE. Ibuprofen at home. Ice pack given from clinic stock. Has been able to ice, wrap and elevate Tuesday and Wed. No wrap today, was running late but ankle is feeling better. No swelling present today. Still no erythema or bruising. Ankle felt lax/loose the other day, hurt worse with pressure on toes. Felt much better with ace wrap in place. Today, this lax feeling has returned and pain with pressure has returned, possibly due to no compression/support in place today.  Patient denied trauma/fall/bruising/rash/improper fitting footwear/slipping/tripping.       Review of Systems  Constitutional: Negative for activity change, appetite change, chills, diaphoresis, fatigue and fever.  HENT: Negative for congestion, ear pain, sore throat, trouble swallowing and voice change.   Eyes: Negative for pain and discharge.  Respiratory: Negative for cough and wheezing.   Cardiovascular: Negative for chest pain and leg swelling.  Gastrointestinal: Negative for abdominal pain, blood in stool, diarrhea, nausea and vomiting.  Endocrine: Negative for cold intolerance and heat intolerance.  Genitourinary: Negative for difficulty urinating and hematuria.  Musculoskeletal: Positive for joint swelling and myalgias. Negative for arthralgias, back pain, gait problem, neck pain and neck stiffness.  Skin: Negative for color change, pallor, rash and wound.  Allergic/Immunologic: Positive for environmental allergies. Negative for food allergies.  Neurological: Negative for dizziness, tremors, seizures, syncope, speech difficulty,  weakness, light-headedness, numbness and headaches.  Hematological: Negative for adenopathy. Does not bruise/bleed easily.  Psychiatric/Behavioral: Negative for agitation, confusion and sleep disturbance. The patient is not nervous/anxious.        Objective:   Physical Exam  Constitutional: She is oriented to person, place, and time. Vital signs are normal. She appears well-developed and well-nourished. She is active and cooperative.  Non-toxic appearance. She does not have a sickly appearance. She does not appear ill. No distress.  HENT:  Head: Normocephalic and atraumatic.  Right Ear: Hearing and external ear normal.  Left Ear: Hearing and external ear normal.  Nose: Nose normal.  Mouth/Throat: Uvula is midline, oropharynx is clear and moist and mucous membranes are normal. No oropharyngeal exudate.  Bilateral allergic shiners  Eyes: Conjunctivae, EOM and lids are normal. Pupils are equal, round, and reactive to light. Right eye exhibits no discharge. Left eye exhibits no discharge. No scleral icterus.  Neck: Trachea normal, normal range of motion and phonation normal. Neck supple. No muscular tenderness present. No neck rigidity. No tracheal deviation, no edema, no erythema and normal range of motion present.  Cardiovascular: Normal rate, regular rhythm, normal heart sounds and intact distal pulses.  Pulses:      Dorsalis pedis pulses are 2+ on the right side, and 2+ on the left side.       Posterior tibial pulses are 2+ on the right side, and 2+ on the left side.  Pulmonary/Chest: Effort normal and breath sounds normal. No accessory muscle usage or stridor. No respiratory distress. She has no decreased breath sounds. She has no wheezes. She has no rhonchi. She has no rales. She exhibits no tenderness.  Abdominal: Soft. Normal appearance. She exhibits no distension, no  fluid wave and no ascites. There is no rigidity and no guarding.  Musculoskeletal: Normal range of motion. She exhibits  tenderness. She exhibits no edema or deformity.       Right shoulder: Normal.       Left shoulder: Normal.       Right elbow: Normal.      Left elbow: Normal.       Right hip: Normal.       Left hip: Normal.       Right knee: Normal.       Left knee: Normal.       Right ankle: Normal.       Left ankle: She exhibits normal range of motion, no swelling, no ecchymosis, no deformity, no laceration and normal Ryan. Tenderness. Lateral malleolus and proximal fibula tenderness found. No medial malleolus and no head of 5th metatarsal tenderness found. Achilles tendon normal. Achilles tendon exhibits no pain and no defect.       Cervical back: Normal.       Thoracic back: Normal.       Lumbar back: Normal.       Right hand: Normal.       Left hand: Normal.       Right lower leg: Normal.       Left lower leg: Normal.       Right foot: Normal.       Left foot: Normal.       Feet:  Retinaculum left anterior ankle TTP and soft tissue anteromedial to left malleolous lateral; full AROM with discomfort; strength equal bilateral ankles 5/5; gait smooth and steady in hallway no limp; on/off exam table; in/out of chair without difficulty; no edema; no crepitus; no bruising; no erythema  Lymphadenopathy:    She has no cervical adenopathy.  Neurological: She is alert and oriented to person, place, and time. She has normal strength. She is not disoriented. She displays no atrophy and no tremor. No cranial nerve deficit or sensory deficit. She exhibits normal muscle tone. She displays no seizure activity. Coordination and gait normal. GCS eye subscore is 4. GCS verbal subscore is 5. GCS motor subscore is 6.  Skin: Skin is warm, dry and intact. No abrasion, no bruising, no burn, no ecchymosis, no laceration, no lesion, no petechiae and no rash noted. She is not diaphoretic. No cyanosis or erythema. No pallor. Nails show no clubbing.  Psychiatric: She has a normal mood and affect. Her speech is normal and  behavior is normal. Judgment and thought content normal. Cognition and memory are normal.  Nursing note and vitals reviewed.     2 inch ace bandage applied figure 8 left ankle/foot in clinic by staff for patient ankle support today as she forgot hers at home.  Discussed to wash one and dry when wearing other one.    Assessment & Plan:  A-left ankle sprain acute initial encounter  P-Patient was instructed to rest, ice and elevate the ankle as much as possible. Activity as tolerated and work on ROM exercises printed and given ankle rehab exercises 3 reps hold 3 seconds once a day this week then increase to 3 reps 5 seconds next week then 3rd week 3 reps/5 seconds/twice a day. Motrin 800mg  po TID prn pain take with food.. Discussed at risk to reinjure ankle over the next year and to wear supportive footwear/ankle sleeve/ace bandage. Follow up with PCM if symptoms persist greater than 4 weeks for re-evaluation.  Exitcare handout on ankle sprain  printed and given to patient.  Patient verbalized agreement and understanding of treatment plan and had no further questions at this time.  P2: Injury Prevention and Fitness.

## 2019-01-21 ENCOUNTER — Other Ambulatory Visit: Payer: Self-pay | Admitting: Obstetrics and Gynecology

## 2019-01-21 DIAGNOSIS — N63 Unspecified lump in unspecified breast: Secondary | ICD-10-CM

## 2019-01-24 ENCOUNTER — Ambulatory Visit
Admission: RE | Admit: 2019-01-24 | Discharge: 2019-01-24 | Disposition: A | Discharge status: Home / Self Care | Payer: 59 | Source: Ambulatory Visit | Attending: Obstetrics and Gynecology | Admitting: Obstetrics and Gynecology

## 2019-01-24 ENCOUNTER — Other Ambulatory Visit: Payer: Self-pay

## 2019-01-24 DIAGNOSIS — N63 Unspecified lump in unspecified breast: Secondary | ICD-10-CM

## 2020-11-16 ENCOUNTER — Ambulatory Visit
Admission: EM | Admit: 2020-11-16 | Discharge: 2020-11-16 | Disposition: A | Discharge status: Home / Self Care | Payer: 59 | Attending: Internal Medicine | Admitting: Internal Medicine

## 2020-11-16 DIAGNOSIS — R21 Rash and other nonspecific skin eruption: Secondary | ICD-10-CM | POA: Diagnosis not present

## 2020-11-16 DIAGNOSIS — B029 Zoster without complications: Secondary | ICD-10-CM

## 2020-11-16 MED ORDER — VALACYCLOVIR HCL 1 G PO TABS: 1000.0000 mg | ORAL_TABLET | Freq: Three times a day (TID) | ORAL | 0 refills | Status: AC

## 2020-11-16 MED ORDER — GABAPENTIN 300 MG PO CAPS: 300.0000 mg | ORAL_CAPSULE | Freq: Every day | ORAL | 0 refills | Status: DC

## 2020-11-16 NOTE — ED Provider Notes (Signed)
[name]    CSN: [id number] Arrival date & time: 11/16/20  0804      History   Chief Complaint Chief Complaint  Patient presents with   Rash    HPI Shirley Ryan is a 40 y.o. female.   Patient presents with itchy, painful rash that has been present to left abdomen for approximately 2 days.  Denies any recent changes to lotions, soaps, detergents, foods, etc.  Patient is concerned for shingles.  Denies any difficulty breathing, shortness of breath, chest pain.  Patient states that she had a pain in her back, and then the rash appeared.   Rash  History reviewed. No pertinent past medical history.  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 No pertinent surgical history.  OB History   No obstetric history on file.      Home Medications    Prior to Admission medications   Medication Sig Start Date End Date Taking? Authorizing Provider  gabapentin (NEURONTIN) 300 MG capsule Take 1 capsule (300 mg total) by mouth daily. Start with 300 mg Daily . May increase to 300 mg twice daily on the second day if needed. 11/16/20  Yes Lance Muss, FNP  valACYclovir (VALTREX) 1000 MG tablet Take 1 tablet (1,000 mg total) by mouth 3 (three) times daily for 7 days. 11/16/20 11/23/20 Yes Lance Muss, FNP    Family History Family History  Problem Relation Age of Onset   Breast cancer Maternal Grandmother     Social History Social History   Tobacco Use   Smoking status: Never   Smokeless tobacco: Never  Substance Use Topics   Alcohol use: No   Drug use: No     Allergies   Patient has no known allergies.   Review of Systems Review of Systems Per HPI  Physical Exam Triage Vital Signs ED Triage Vitals  Enc Vitals Group     BP 11/16/20 0820 128/84     Pulse Rate 11/16/20 0820 86     Resp 11/16/20 0820 18     Temp 11/16/20 0820 98.3 F (36.8 C)     Temp Source  11/16/20 0820 Oral     SpO2 11/16/20 0820 98 %     Weight --      Height --      Head Circumference --      Peak Flow --      Pain Score 11/16/20 0821 5     Pain Loc --      Pain Edu? --      Excl. in GC? --    No data found.  Updated Vital Signs BP 128/84 (BP Location: Left Arm)   Pulse 86   Temp 98.3 F (36.8 C) (Oral)   Resp 18   LMP 11/02/2020   SpO2 98%   Visual Acuity Right Eye Distance:   Left Eye Distance:   Bilateral Distance:    Right Eye Near:   Left Eye Near:    Bilateral Near:     Physical Exam Constitutional:      Appearance: Normal appearance.  HENT:     Head: Normocephalic and atraumatic.  Eyes:     Extraocular Movements: Extraocular movements intact.     Conjunctiva/sclera: Conjunctivae normal.  Pulmonary:     Effort: Pulmonary effort is normal.  Skin:    General: Skin is warm and dry.  Findings: Rash present. Rash is vesicular.     Comments: Erythematous, vesicular small area of clustered lesions to left upper quadrant/side.  Neurological:     General: No focal deficit present.     Mental Status: She is alert and oriented to person, place, and time. Mental status is at baseline.  Psychiatric:        Mood and Affect: Mood normal.        Behavior: Behavior normal.        Thought Content: Thought content normal.        Judgment: Judgment normal.     UC Treatments / Results  Labs (all labs ordered are listed, but only abnormal results are displayed) Labs Reviewed - No data to display  EKG   Radiology No results found.  Procedures Procedures (including critical care time)  Medications Ordered in UC Medications - No data to display  Initial Impression / Assessment and Plan / UC Course  I have reviewed the triage vital signs and the nursing notes.  Pertinent labs & imaging results that were available during my care of the patient were reviewed by me and considered in my medical decision making (see chart for details).      Rash is most consistent with shingles rash.  Valacyclovir x7 days prescribed to treat.  Patient was also prescribed gabapentin to take as needed for pain and discomfort.  Advised patient that this medication may cause drowsiness.  Patient to follow-up if symptoms worsen or if rash does not improve.Discussed strict return precautions. Patient verbalized understanding and is agreeable with plan.  Final Clinical Impressions(s) / UC Diagnoses   Final diagnoses:  Herpes zoster without complication  Rash     Discharge Instructions      You are being treated with valacyclovir antiviral medication for shingles rash.  Please take as prescribed.  You have also been prescribed gabapentin to take as needed for pain and discomfort.  Please be advised that this may cause drowsiness for some people.     ED Prescriptions     Medication Sig Dispense Auth. Provider   valACYclovir (VALTREX) 1000 MG tablet Take 1 tablet (1,000 mg total) by mouth 3 (three) times daily for 7 days. 21 tablet Henrene Dodge E, FNP   gabapentin (NEURONTIN) 300 MG capsule Take 1 capsule (300 mg total) by mouth daily. Start with 300 mg Daily . May increase to 300 mg twice daily on the second day if needed. 30 capsule Lance Muss, FNP      PDMP not reviewed this encounter.   Lance Muss, FNP 11/16/20 [phone]

## 2020-11-16 NOTE — Discharge Instructions (Addendum)
You are being treated with valacyclovir antiviral medication for shingles rash.  Please take as prescribed.  You have also been prescribed gabapentin to take as needed for pain and discomfort.  Please be advised that this may cause drowsiness for some people.

## 2020-11-16 NOTE — ED Triage Notes (Signed)
Pt c/o itchy rash to lt side since Sunday and now having pain to lt mid back. Denies injury.

## 2021-04-05 ENCOUNTER — Ambulatory Visit
Admission: EM | Admit: 2021-04-05 | Discharge: 2021-04-05 | Disposition: A | Discharge status: Home / Self Care | Payer: 59 | Attending: Internal Medicine | Admitting: Internal Medicine

## 2021-04-05 ENCOUNTER — Other Ambulatory Visit: Payer: Self-pay

## 2021-04-05 DIAGNOSIS — Z1152 Encounter for screening for COVID-19: Secondary | ICD-10-CM | POA: Insufficient documentation

## 2021-04-05 DIAGNOSIS — J029 Acute pharyngitis, unspecified: Secondary | ICD-10-CM | POA: Insufficient documentation

## 2021-04-05 DIAGNOSIS — H65191 Other acute nonsuppurative otitis media, right ear: Secondary | ICD-10-CM | POA: Diagnosis present

## 2021-04-05 DIAGNOSIS — R590 Localized enlarged lymph nodes: Secondary | ICD-10-CM | POA: Insufficient documentation

## 2021-04-05 DIAGNOSIS — J069 Acute upper respiratory infection, unspecified: Secondary | ICD-10-CM | POA: Diagnosis present

## 2021-04-05 LAB — POCT RAPID STREP A (OFFICE): Rapid Strep A Screen: NEGATIVE

## 2021-04-05 MED ORDER — BENZONATATE 100 MG PO CAPS: 100.0000 mg | ORAL_CAPSULE | Freq: Three times a day (TID) | ORAL | 0 refills | Status: DC | PRN

## 2021-04-05 MED ORDER — AMOXICILLIN-POT CLAVULANATE 875-125 MG PO TABS: 1.0000 | ORAL_TABLET | Freq: Two times a day (BID) | ORAL | 0 refills | Status: DC

## 2021-04-05 MED ORDER — FLUTICASONE PROPIONATE 50 MCG/ACT NA SUSP: 1.0000 | Freq: Every day | NASAL | 0 refills | Status: DC

## 2021-04-05 NOTE — ED Triage Notes (Incomplete)
Pt is here with chills, cough and a fever since, pt has taken to relieve discomfort.

## 2021-04-05 NOTE — ED Triage Notes (Signed)
Patient presents to Urgent Care with complaints of fever, cough, and chills since 12/24. Treating symptoms with Claritin and sudafed.

## 2021-04-05 NOTE — Discharge Instructions (Signed)
Rapid strep was negative.  Throat culture, COVID-19, flu test is pending.  You have been prescribed an antibiotic to treat right ear infection as well as upper respiratory infection.  Cough medication and Flonase has also been prescribed to help alleviate your other symptoms.  Please follow-up if symptoms persist or worsen.

## 2021-04-05 NOTE — ED Provider Notes (Signed)
[name]    CSN: [id number] Arrival date & time: 04/05/21  6629      History   Chief Complaint Chief Complaint  Patient presents with   Cough   Fever   Chills    HPI Kerry-Anne JANNINE ABREU is a 40 y.o. female.   Patient presents with 3 to 4-day history of cough, chills, bilateral ear pain, sore throat, nasal congestion.  Denies any fever, chest pain, shortness of breath, nausea, vomiting, diarrhea, abdominal pain.  Son has had similar symptoms recently.  Patient has been using Claritin and Sudafed with minimal improvement in symptoms.   Cough Fever  History reviewed. No pertinent past medical history.  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 No pertinent surgical history.  OB History   No obstetric history on file.      Home Medications    Prior to Admission medications   Medication Sig Start Date End Date Taking? Authorizing Provider  amoxicillin-clavulanate (AUGMENTIN) 875-125 MG tablet Take 1 tablet by mouth every 12 (twelve) hours. 04/05/21  Yes Ellesse Antenucci, Rolly Salter E, FNP  benzonatate (TESSALON) 100 MG capsule Take 1 capsule (100 mg total) by mouth every 8 (eight) hours as needed for cough. 04/05/21  Yes Cadel Stairs, Rolly Salter E, FNP  fluticasone (FLONASE) 50 MCG/ACT nasal spray Place 1 spray into both nostrils daily for 3 days. 04/05/21 04/08/21 Yes Givanni Staron, Acie Fredrickson, FNP  gabapentin (NEURONTIN) 300 MG capsule Take 1 capsule (300 mg total) by mouth daily. Start with 300 mg Daily . May increase to 300 mg twice daily on the second day if needed. 11/16/20   Gustavus Bryant, FNP    Family History Family History  Problem Relation Age of Onset   Breast cancer Maternal Grandmother     Social History Social History   Tobacco Use   Smoking status: Never   Smokeless tobacco: Never  Substance Use Topics   Alcohol use: No   Drug use: No     Allergies   Patient has no known  allergies.   Review of Systems Review of Systems Per HPI  Physical Exam Triage Vital Signs ED Triage Vitals  Enc Vitals Group     BP 04/05/21 1153 125/84     Pulse Rate 04/05/21 1153 90     Resp 04/05/21 1153 16     Temp 04/05/21 1153 98.2 F (36.8 C)     Temp Source 04/05/21 1147 Oral     SpO2 04/05/21 1153 98 %     Weight --      Height --      Head Circumference --      Peak Flow --      Pain Score 04/05/21 1144 5     Pain Loc --      Pain Edu? --      Excl. in GC? --    No data found.  Updated Vital Signs BP 125/84 (BP Location: Left Arm)    Pulse 90    Temp 98.2 F (36.8 C) (Oral)    Resp 16    SpO2 98%   Visual Acuity Right Eye Distance:   Left Eye Distance:   Bilateral Distance:    Right Eye Near:   Left Eye Near:    Bilateral Near:     Physical Exam Constitutional:      General: She is not in acute distress.    Appearance: Normal  appearance. She is not toxic-appearing or diaphoretic.  HENT:     Head: Normocephalic and atraumatic.     Right Ear: Tympanic membrane and ear canal normal.     Left Ear: Tympanic membrane and ear canal normal.     Nose: Congestion present.     Mouth/Throat:     Lips: Pink.     Mouth: Mucous membranes are moist.     Pharynx: Posterior oropharyngeal erythema present. No pharyngeal swelling, oropharyngeal exudate or uvula swelling.     Tonsils: No tonsillar exudate or tonsillar abscesses. 1+ on the right. 1+ on the left.  Eyes:     Extraocular Movements: Extraocular movements intact.     Conjunctiva/sclera: Conjunctivae normal.     Pupils: Pupils are equal, round, and reactive to light.  Cardiovascular:     Rate and Rhythm: Normal rate and regular rhythm.     Pulses: Normal pulses.     Heart sounds: Normal heart sounds.  Pulmonary:     Effort: Pulmonary effort is normal. No respiratory distress.     Breath sounds: Normal breath sounds. No stridor. No wheezing, rhonchi or rales.  Abdominal:     General: Abdomen is  flat. Bowel sounds are normal.     Palpations: Abdomen is soft.  Musculoskeletal:        General: Normal range of motion.     Cervical back: Normal range of motion.  Lymphadenopathy:     Cervical: Cervical adenopathy present.     Right cervical: Superficial cervical adenopathy present.  Skin:    General: Skin is warm and dry.  Neurological:     General: No focal deficit present.     Mental Status: She is alert and oriented to person, place, and time. Mental status is at baseline.  Psychiatric:        Mood and Affect: Mood normal.        Behavior: Behavior normal.     UC Treatments / Results  Labs (all labs ordered are listed, but only abnormal results are displayed) Labs Reviewed  COVID-19, FLU A+B NAA  CULTURE, GROUP A STREP [facility])  POCT RAPID STREP A (OFFICE)    EKG   Radiology No results found.  Procedures Procedures (including critical care time)  Medications Ordered in UC Medications - No data to display  Initial Impression / Assessment and Plan / UC Course  I have reviewed the triage vital signs and the nursing notes.  Pertinent labs & imaging results that were available during my care of the patient were reviewed by me and considered in my medical decision making (see chart for details).     Patient presents with symptoms likely from a viral upper respiratory infection. Differential includes bacterial pneumonia, sinusitis, allergic rhinitis, COVID-19, flu. Do not suspect underlying cardiopulmonary process. Symptoms seem unlikely related to ACS, CHF or COPD exacerbations, pneumonia, pneumothorax. Patient is nontoxic appearing and not in need of emergent medical intervention.  Rapid strep was negative.  Throat culture, COVID-19, flu swab pending.  Will treat right ear infection with Augmentin antibiotic.  Antibiotic also to treat acute tonsillitis.  No signs of peritonsillar abscess on exam.  Discussed supportive care and symptom management with  patient.  Return if symptoms fail to improve in 1-2 weeks or you develop shortness of breath, chest pain, severe headache. Patient states understanding and is agreeable.  Discharged with PCP followup.  Final Clinical Impressions(s) / UC Diagnoses   Final diagnoses:  Encounter for screening for COVID-19  Other non-recurrent  acute nonsuppurative otitis media of right ear  Cervical lymphadenopathy  Sore throat  Viral upper respiratory tract infection with cough     Discharge Instructions      Rapid strep was negative.  Throat culture, COVID-19, flu test is pending.  You have been prescribed an antibiotic to treat right ear infection as well as upper respiratory infection.  Cough medication and Flonase has also been prescribed to help alleviate your other symptoms.  Please follow-up if symptoms persist or worsen.    ED Prescriptions     Medication Sig Dispense Auth. Provider   amoxicillin-clavulanate (AUGMENTIN) 875-125 MG tablet Take 1 tablet by mouth every 12 (twelve) hours. 14 tablet [place], [place] E, [place]   benzonatate (TESSALON) 100 MG capsule Take 1 capsule (100 mg total) by mouth every 8 (eight) hours as needed for cough. 21 capsule [place], [place] E, [place]   fluticasone [facility]) 50 MCG/ACT nasal spray Place 1 spray into both nostrils daily for 3 days. 16 g Gustavus Bryant, [place]      PDMP not reviewed this encounter.   Gustavus Bryant, [place] 04/05/21 [phone]

## 2021-04-06 LAB — COVID-19, FLU A+B NAA
Influenza A, NAA: NOT DETECTED
Influenza B, NAA: NOT DETECTED
SARS-CoV-2, NAA: NOT DETECTED

## 2021-04-07 LAB — CULTURE, GROUP A STREP (THRC)

## 2021-07-27 ENCOUNTER — Other Ambulatory Visit: Payer: Self-pay | Admitting: Obstetrics and Gynecology

## 2021-07-27 DIAGNOSIS — Z1231 Encounter for screening mammogram for malignant neoplasm of breast: Secondary | ICD-10-CM

## 2021-08-02 ENCOUNTER — Ambulatory Visit
Admission: RE | Admit: 2021-08-02 | Discharge: 2021-08-02 | Disposition: A | Discharge status: Home / Self Care | Payer: 59 | Source: Ambulatory Visit | Attending: Obstetrics and Gynecology | Admitting: Obstetrics and Gynecology

## 2021-08-02 DIAGNOSIS — Z1231 Encounter for screening mammogram for malignant neoplasm of breast: Secondary | ICD-10-CM

## 2021-08-26 ENCOUNTER — Ambulatory Visit
Admission: EM | Admit: 2021-08-26 | Discharge: 2021-08-26 | Disposition: A | Discharge status: Home / Self Care | Payer: 59 | Attending: Internal Medicine | Admitting: Internal Medicine

## 2021-08-26 DIAGNOSIS — J029 Acute pharyngitis, unspecified: Secondary | ICD-10-CM | POA: Diagnosis present

## 2021-08-26 DIAGNOSIS — J069 Acute upper respiratory infection, unspecified: Secondary | ICD-10-CM

## 2021-08-26 LAB — POCT RAPID STREP A (OFFICE): Rapid Strep A Screen: NEGATIVE

## 2021-08-26 MED ORDER — BENZONATATE 100 MG PO CAPS: 100.0000 mg | ORAL_CAPSULE | Freq: Three times a day (TID) | ORAL | 0 refills | Status: DC | PRN

## 2021-08-26 MED ORDER — FLUTICASONE PROPIONATE 50 MCG/ACT NA SUSP: 1.0000 | Freq: Every day | NASAL | 0 refills | Status: DC

## 2021-08-26 NOTE — ED Triage Notes (Signed)
Pt c/o sore throat, nasal drainage, cough, nausea  Denies ear ache, headache, body aches, chills   Onset ~ Wednesday   Concerned for allergies states her throat hurts yearly but reports this year seems more sore.

## 2021-08-26 NOTE — ED Provider Notes (Signed)
[name]    CSN: [id number] Arrival date & time: 08/26/21  0802      History   Chief Complaint Chief Complaint  Patient presents with   Sore Throat    HPI Shirley Ryan is a 41 y.o. female.   Patient presents with sore throat, cough, nasal congestion, nausea present for 2 to 3 days.  Denies any known fevers or sick contacts.  Denies chest pain, shortness of breath, ear pain, nausea, vomiting, diarrhea, abdominal pain.  Has taken cetirizine for symptoms with minimal improvement.  Sore throat is main complaint per patient. Denies history of asthma or COPD.    Sore Throat   History reviewed. No pertinent past medical history.  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 No pertinent surgical history.  OB History   No obstetric history on file.      Home Medications    Prior to Admission medications   Medication Sig Start Date End Date Taking? Authorizing Provider  benzonatate (TESSALON) 100 MG capsule Take 1 capsule (100 mg total) by mouth every 8 (eight) hours as needed for cough. 08/26/21  Yes Garvey Westcott, Michele Rockers, FNP  fluticasone (FLONASE) 50 MCG/ACT nasal spray Place 1 spray into both nostrils daily for 3 days. 08/26/21 08/29/21 Yes Asenath Balash, Michele Rockers, FNP  gabapentin (NEURONTIN) 300 MG capsule Take 1 capsule (300 mg total) by mouth daily. Start with 300 mg Daily . May increase to 300 mg twice daily on the second day if needed. 11/16/20   Teodora Medici, FNP    Family History Family History  Problem Relation Age of Onset   Breast cancer Maternal Grandmother     Social History Social History   Tobacco Use   Smoking status: Never   Smokeless tobacco: Never  Substance Use Topics   Alcohol use: No   Drug use: No     Allergies   Patient has no known allergies.   Review of Systems Review of Systems Per HPI  Physical Exam Triage Vital Signs ED Triage Vitals  Enc  Vitals Group     BP 08/26/21 0819 127/88     Pulse Rate 08/26/21 0819 82     Resp 08/26/21 0819 18     Temp 08/26/21 0819 98.4 F (36.9 C)     Temp Source 08/26/21 0819 Oral     SpO2 08/26/21 0819 98 %     Weight --      Height --      Head Circumference --      Peak Flow --      Pain Score 08/26/21 0820 0     Pain Loc --      Pain Edu? --      Excl. in [place]? --    No data found.  Updated Vital Signs BP 127/88 (BP Location: Left Arm)   Pulse 82   Temp 98.4 F (36.9 C) (Oral)   Resp 18   SpO2 98%   Visual Acuity Right Eye Distance:   Left Eye Distance:   Bilateral Distance:    Right Eye Near:   Left Eye Near:    Bilateral Near:     Physical Exam Constitutional:      General: She is not in acute distress.    Appearance: Normal appearance. She is not toxic-appearing or diaphoretic.  HENT:     Head: Normocephalic and atraumatic.  Right Ear: Tympanic membrane and ear canal normal.     Left Ear: Tympanic membrane and ear canal normal.     Nose: Congestion present.     Mouth/Throat:     Mouth: Mucous membranes are moist.     Pharynx: Posterior oropharyngeal erythema present.  Eyes:     Extraocular Movements: Extraocular movements intact.     Conjunctiva/sclera: Conjunctivae normal.     Pupils: Pupils are equal, round, and reactive to light.  Cardiovascular:     Rate and Rhythm: Normal rate and regular rhythm.     Pulses: Normal pulses.     Heart sounds: Normal heart sounds.  Pulmonary:     Effort: Pulmonary effort is normal. No respiratory distress.     Breath sounds: Normal breath sounds. No stridor. No wheezing, rhonchi or rales.  Abdominal:     General: Abdomen is flat. Bowel sounds are normal.     Palpations: Abdomen is soft.  Musculoskeletal:        General: Normal range of motion.     Cervical back: Normal range of motion.  Skin:    General: Skin is warm and dry.  Neurological:     General: No focal deficit present.     Mental Status: She is alert  and oriented to person, place, and time. Mental status is at baseline.  Psychiatric:        Mood and Affect: Mood normal.        Behavior: Behavior normal.     UC Treatments / Results  Labs (all labs ordered are listed, but only abnormal results are displayed) Labs Reviewed  CULTURE, GROUP A STREP ([place])  NOVEL CORONAVIRUS, NAA  POCT RAPID STREP A (OFFICE)    EKG   Radiology No results found.  Procedures Procedures (including critical care time)  Medications Ordered in UC Medications - No data to display  Initial Impression / Assessment and Plan / UC Course  I have reviewed the triage vital signs and the nursing notes.  Pertinent labs & imaging results that were available during my care of the patient were reviewed by me and considered in my medical decision making (see chart for details).     Patient presents with symptoms likely from a viral upper respiratory infection. Differential includes bacterial pneumonia, sinusitis, allergic rhinitis, Covid 19, flu. Do not suspect underlying cardiopulmonary process. Symptoms seem unlikely related to ACS, CHF or COPD exacerbations, pneumonia, pneumothorax. Patient is nontoxic appearing and not in need of emergent medical intervention. Rapid strep was negative. Throat culture and covid test is pending.   Recommended symptom control with over the counter medications: Daily oral anti-histamine, Oral decongestant or IN corticosteroid, saline irrigations, cepacol lozenges, Robitussin, Delsym, honey tea. Patient sent prescriptions.   Return if symptoms fail to improve in 1-2 weeks or you develop shortness of breath, chest pain, severe headache. Patient states understanding and is agreeable.  Discharged with PCP followup.  Final Clinical Impressions(s) / UC Diagnoses   Final diagnoses:  Viral upper respiratory tract infection with cough  Sore throat     Discharge Instructions      Your rapid strep test was negative.  Throat  culture and COVID test are pending.  We will call if they are positive.  It appears that you have a viral upper respiratory infection that should run its course and self resolve with symptomatic treatment in the next few days.  You have been prescribed 2 medications to alleviate symptoms.  Please follow up if symptoms  persist or worsen.    ED Prescriptions     Medication Sig Dispense Auth. Provider   fluticasone (FLONASE) 50 MCG/ACT nasal spray Place 1 spray into both nostrils daily for 3 days. 16 g Neviah Braud, Hildred Alamin E, [place]   benzonatate (TESSALON) 100 MG capsule Take 1 capsule (100 mg total) by mouth every 8 (eight) hours as needed for cough. 21 capsule [place], Michele Rockers, [place]      PDMP not reviewed this encounter.   Teodora Medici,  08/26/21 [phone]

## 2021-08-26 NOTE — Discharge Instructions (Addendum)
Your rapid strep test was negative.  Throat culture and COVID test are pending.  We will call if they are positive.  It appears that you have a viral upper respiratory infection that should run its course and self resolve with symptomatic treatment in the next few days.  You have been prescribed 2 medications to alleviate symptoms.  Please follow up if symptoms persist or worsen.

## 2021-08-27 LAB — NOVEL CORONAVIRUS, NAA: SARS-CoV-2, NAA: NOT DETECTED

## 2021-08-29 LAB — CULTURE, GROUP A STREP (THRC)

## 2022-06-19 ENCOUNTER — Other Ambulatory Visit: Payer: Self-pay | Admitting: Obstetrics and Gynecology

## 2022-06-19 DIAGNOSIS — Z1231 Encounter for screening mammogram for malignant neoplasm of breast: Secondary | ICD-10-CM

## 2022-08-07 ENCOUNTER — Ambulatory Visit
Admission: RE | Admit: 2022-08-07 | Discharge: 2022-08-07 | Disposition: A | Discharge status: Home / Self Care | Payer: 59 | Source: Ambulatory Visit | Attending: Obstetrics and Gynecology | Admitting: Obstetrics and Gynecology

## 2022-08-07 DIAGNOSIS — Z1231 Encounter for screening mammogram for malignant neoplasm of breast: Secondary | ICD-10-CM

## 2022-09-14 IMAGING — MG MM DIGITAL SCREENING BILAT W/ TOMO AND CAD
8 series · 8 of 24 positions shown · non-contrast
Comparison: Previous exam(s).

CLINICAL DATA: Screening.

EXAM:
DIGITAL SCREENING BILATERAL MAMMOGRAM WITH TOMOSYNTHESIS AND CAD
TECHNIQUE: Bilateral screening digital craniocaudal and mediolateral oblique
mammograms were obtained. Bilateral screening digital breast
tomosynthesis was performed. The images were evaluated with
computer-aided detection.

[L MLO synth-2D]
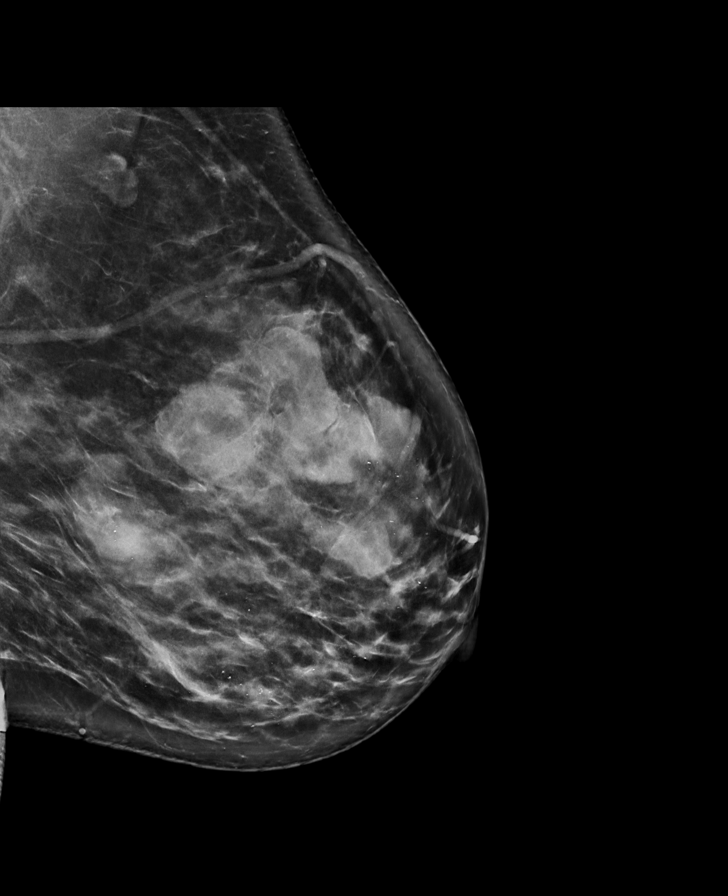

[L CC synth-2D]
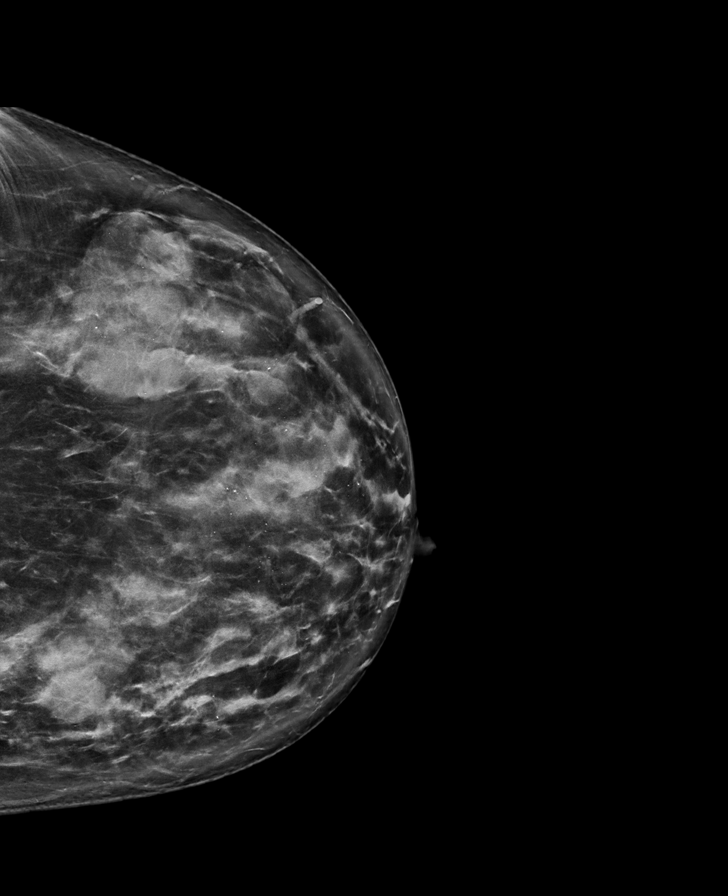

[R MLO synth-2D]
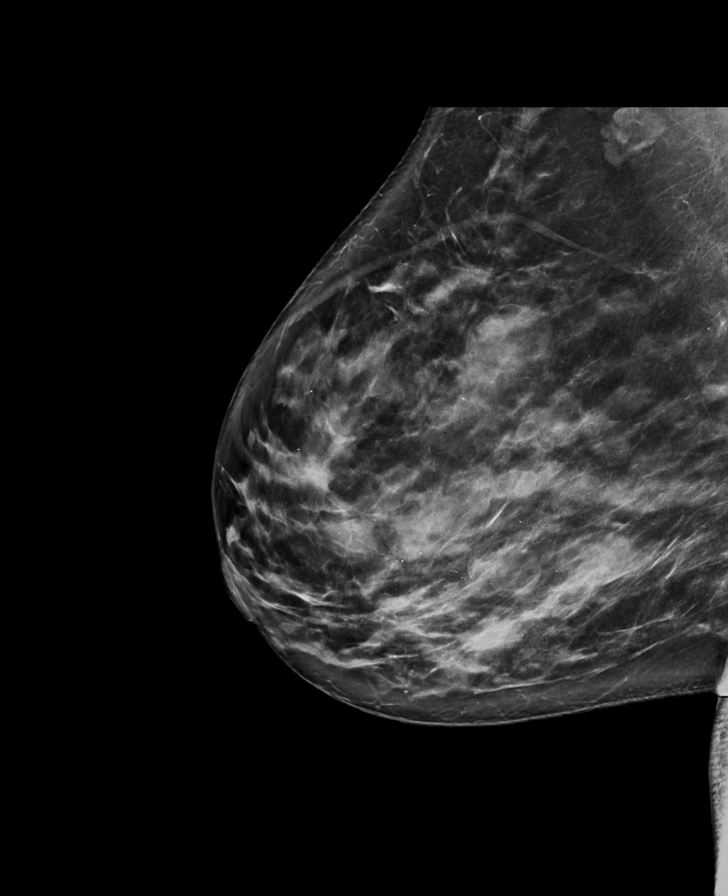

[R CC synth-2D]
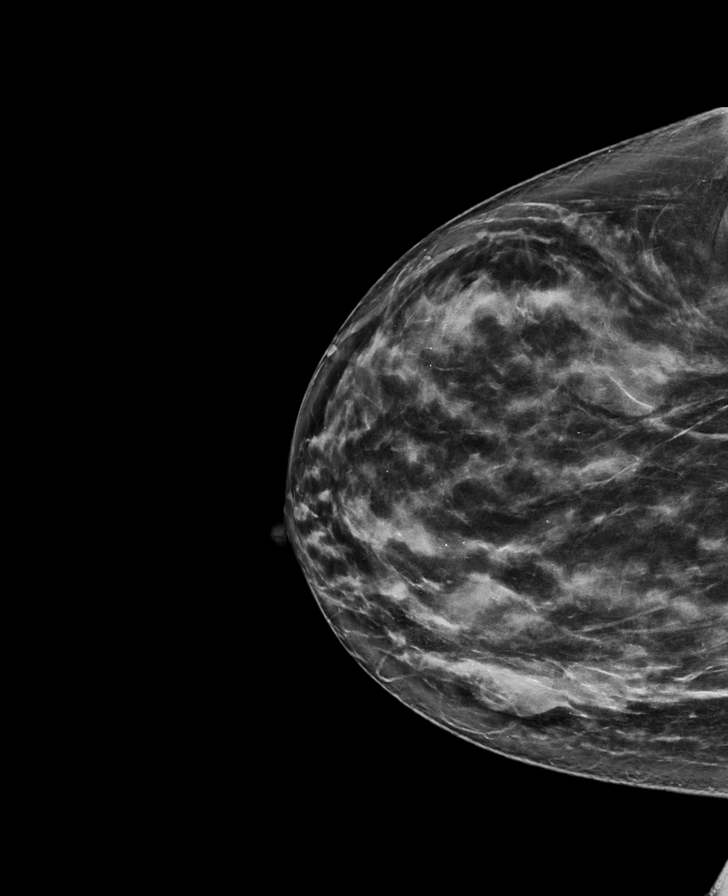

[R CC tomo · tomo slice 43/86.0]
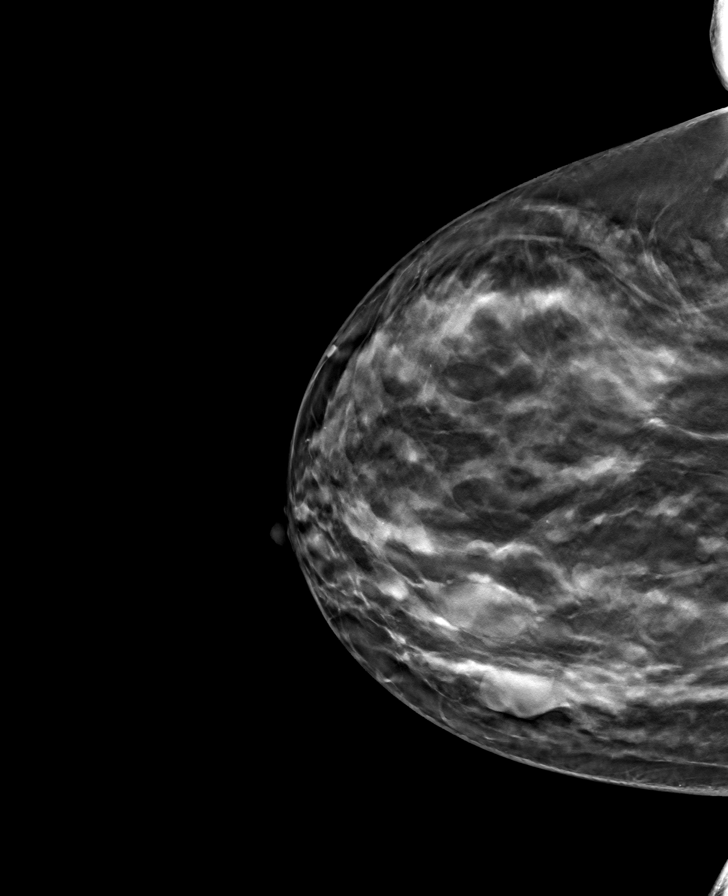

[L CC tomo · tomo slice 45/89.0]
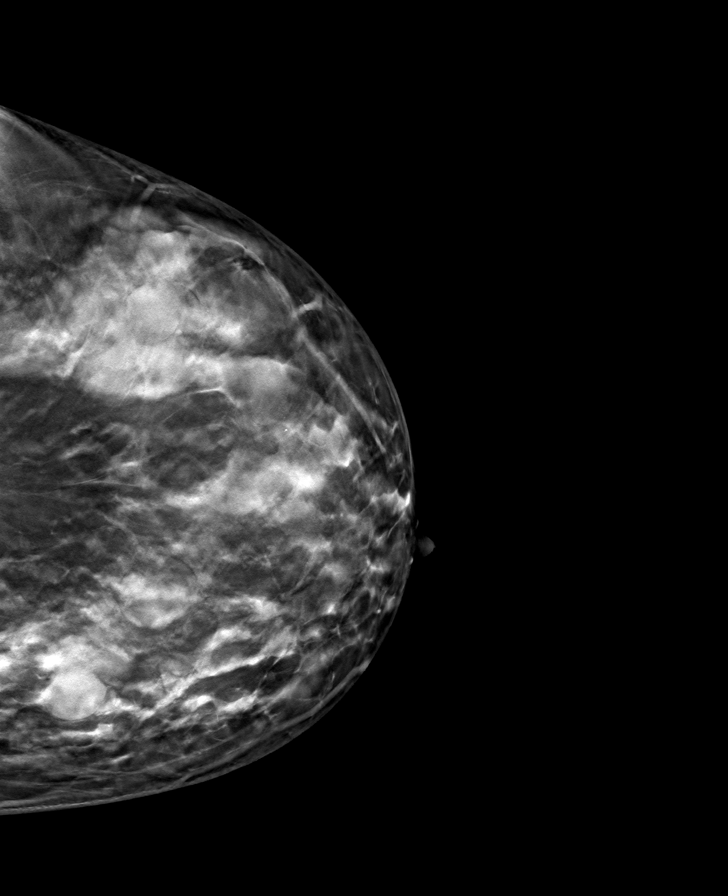

[R MLO tomo · tomo slice 48/95.0]
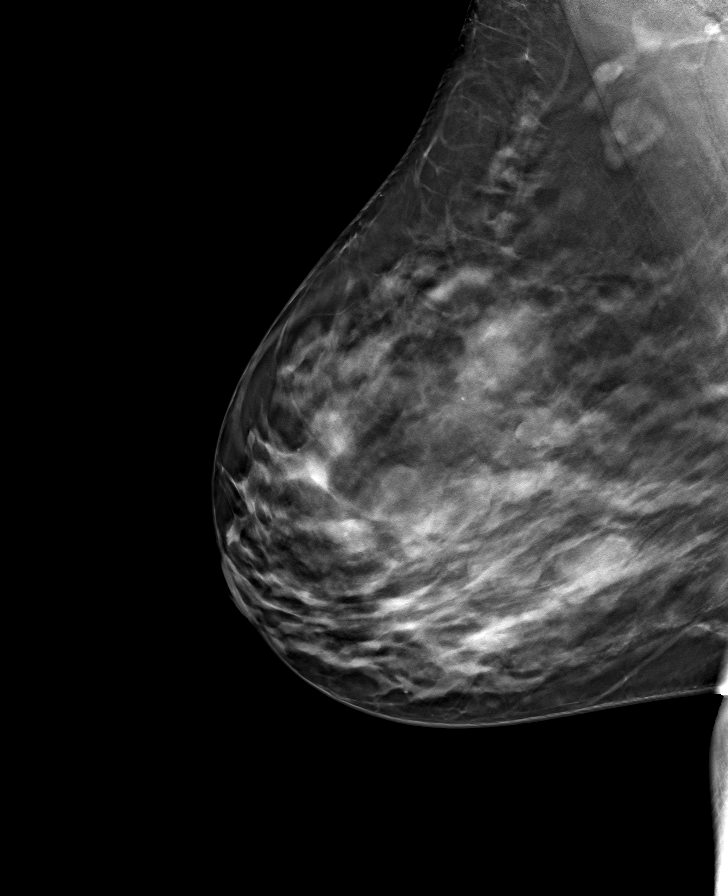

[L MLO tomo · tomo slice 49/96.0]
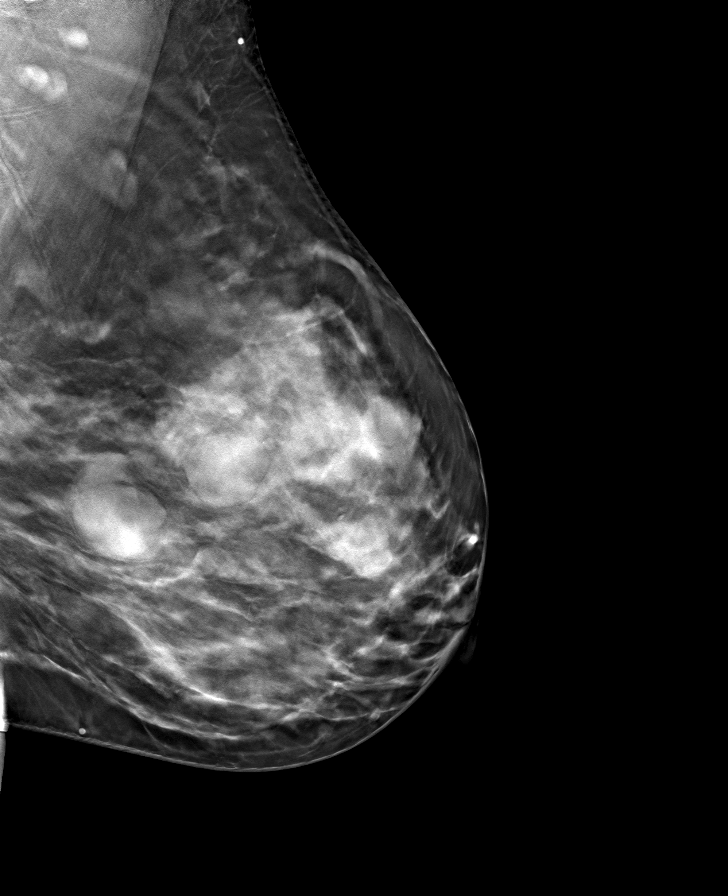

[8 of 24 positions shown; findings below may reference images not displayed]

ACR Breast Density Category c: The breast tissue is heterogeneously
dense, which may obscure small masses.
FINDINGS: There are no findings suspicious for malignancy. There are multiple
bilateral circumscribed similar in appearance masses, consistent
with benign cysts.
IMPRESSION: No mammographic evidence of malignancy. A result letter of this
screening mammogram will be mailed directly to the patient.

RECOMMENDATION:
Screening mammogram in one year. (Code:Z1-B-7MB)

BI-RADS CATEGORY  2: Benign.

## 2023-04-18 ENCOUNTER — Ambulatory Visit
Admission: RE | Admit: 2023-04-18 | Discharge: 2023-04-18 | Disposition: A | Discharge status: Home / Self Care | Payer: Managed Care, Other (non HMO) | Source: Ambulatory Visit | Attending: Physician Assistant | Admitting: Physician Assistant

## 2023-04-18 ENCOUNTER — Ambulatory Visit: Payer: Self-pay

## 2023-04-18 ENCOUNTER — Ambulatory Visit (INDEPENDENT_AMBULATORY_CARE_PROVIDER_SITE_OTHER): Payer: Managed Care, Other (non HMO)

## 2023-04-18 VITALS — BP 124/80 | HR 86 | Temp 97.9°F | Resp 18

## 2023-04-18 DIAGNOSIS — J40 Bronchitis, not specified as acute or chronic: Secondary | ICD-10-CM

## 2023-04-18 DIAGNOSIS — J329 Chronic sinusitis, unspecified: Secondary | ICD-10-CM

## 2023-04-18 DIAGNOSIS — R053 Chronic cough: Secondary | ICD-10-CM | POA: Diagnosis not present

## 2023-04-18 HISTORY — DX: Other specified health status: Z78.9

## 2023-04-18 MED ORDER — PREDNISONE 20 MG PO TABS: 40.0000 mg | ORAL_TABLET | Freq: Every day | ORAL | 0 refills | Status: AC

## 2023-04-18 MED ORDER — AMOXICILLIN-POT CLAVULANATE 875-125 MG PO TABS: 1.0000 | ORAL_TABLET | Freq: Two times a day (BID) | ORAL | 0 refills | Status: DC

## 2023-04-18 NOTE — ED Provider Notes (Signed)
 [name]    CSN: [id number] Arrival date & time: 04/18/23  1040      History   Chief Complaint Chief Complaint  Patient presents with   Nasal Congestion    Entered by patient   Cough    HPI Shirley Ryan is a 43 y.o. female.   Patient here today for evaluation of cough congestion she has had for several weeks.  She reports that she has not had any fever.  She denies any vomiting or diarrhea other than 1 episode of vomiting after getting choked on mucus at nighttime and coughing to the point of vomiting.  She does report some hoarseness and has tried taking Claritin -D without resolution.  She reports overall she does not feel bad.  The history is provided by the patient.  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      Home Medications    Prior to Admission medications   Medication Sig Start Date End Date Taking? Authorizing Provider  amoxicillin -clavulanate (AUGMENTIN ) 875-125 MG tablet Take 1 tablet by mouth every 12 (twelve) hours. 04/18/23  Yes Billy Asberry FALCON, PA-C  fluticasone  (FLONASE ) 50 MCG/ACT nasal spray Place 1 spray into both nostrils daily for 3 days. 08/26/21 04/18/23 Yes [place], Darryle BRAVO, FNP  predniSONE  (DELTASONE ) 20 MG tablet Take 2 tablets (40 mg total) by mouth daily with breakfast for 5 days. 04/18/23 04/23/23 Yes Billy Asberry FALCON, PA-C  benzonatate  (TESSALON ) 100 MG capsule Take 1 capsule (100 mg total) by mouth every 8 (eight) hours as needed for cough. Patient not taking: Reported on 04/18/2023 08/26/21   Hazen Darryle BRAVO, FNP   gabapentin  (NEURONTIN ) 300 MG capsule Take 1 capsule (300 mg total) by mouth daily. Start with 300 mg Daily . May increase to 300 mg twice daily on the second day if needed. Patient not taking: Reported on 04/18/2023 11/16/20   Hazen Darryle BRAVO, FNP    Family History Family History  Problem Relation Age of Onset   Diabetes Mellitus II Father    Cancer Father    Breast cancer Maternal Grandmother     Social History Social History   Tobacco Use   Smoking status: Never   Smokeless tobacco: Never  Vaping Use   Vaping status: Never Used  Substance Use Topics   Alcohol use: No   Drug use: No     Allergies   Patient has no known allergies.   Review of Systems Review of Systems  Constitutional:  Negative for chills and fever.  HENT:  Positive for congestion, sinus pressure and voice change. Negative for ear pain and sore throat.   Eyes:  Negative for discharge and redness.  Respiratory:  Positive for cough. Negative for shortness of breath and wheezing.   Gastrointestinal:  Negative for abdominal pain, diarrhea, nausea and vomiting.     Physical Exam Triage Vital Signs ED Triage Vitals  Encounter Vitals Group     BP 04/18/23 1144 124/80  Systolic BP Percentile --      Diastolic BP Percentile --      Pulse Rate 04/18/23 1144 86     Resp 04/18/23 1144 18     Temp 04/18/23 1144 97.9 F (36.6 C)     Temp Source 04/18/23 1144 Oral     SpO2 04/18/23 1144 96 %     Weight --      Height --      Head Circumference --      Peak Flow --      Pain Score 04/18/23 1146 5     Pain Loc --      Pain Education --      Exclude from Growth Chart --    No data found.  Updated Vital Signs BP 124/80 (BP Location: Left Arm)   Pulse 86   Temp 97.9 F (36.6 C) (Oral)   Resp 18   LMP 03/28/2023 (Approximate)   SpO2 96%   Visual Acuity Right Eye Distance:   Left Eye Distance:   Bilateral Distance:    Right Eye Near:   Left Eye Near:    Bilateral Near:     Physical  Exam Vitals and nursing note reviewed.  Constitutional:      General: She is not in acute distress.    Appearance: Normal appearance. She is not ill-appearing.  HENT:     Head: Normocephalic and atraumatic.     Right Ear: Tympanic membrane normal.     Left Ear: There is impacted cerumen.     Nose: Congestion present.     Mouth/Throat:     Mouth: Mucous membranes are moist.     Pharynx: No oropharyngeal exudate or posterior oropharyngeal erythema.  Eyes:     Conjunctiva/sclera: Conjunctivae normal.  Cardiovascular:     Rate and Rhythm: Normal rate and regular rhythm.     Heart sounds: Normal heart sounds. No murmur heard. Pulmonary:     Effort: Pulmonary effort is normal. No respiratory distress.     Breath sounds: Normal breath sounds. No wheezing, rhonchi or rales.  Skin:    General: Skin is warm and dry.  Neurological:     Mental Status: She is alert.  Psychiatric:        Mood and Affect: Mood normal.        Thought Content: Thought content normal.      UC Treatments / Results  Labs (all labs ordered are listed, but only abnormal results are displayed) Labs Reviewed - No data to display  EKG   Radiology DG Chest 2 View Result Date: 04/18/2023 CLINICAL DATA:  Productive cough for 2 weeks. EXAM: CHEST - 2 VIEW COMPARISON:  None Available. FINDINGS: The heart size and mediastinal contours are within normal limits. Both lungs are clear. The visualized skeletal structures are unremarkable. IMPRESSION: No active cardiopulmonary disease. Electronically Signed   By: Norleen DELENA Kil M.D.   On: 04/18/2023 12:57    Procedures Procedures (including critical care time)  Medications Ordered in UC Medications - No data to display  Initial Impression / Assessment and Plan / UC Course  I have reviewed the triage vital signs and the nursing notes.  Pertinent labs & imaging results that were available during my care of the patient were reviewed by me and considered in my medical  decision making (see chart for details).     Chest x-ray without acute findings.  Suspect likely sinobronchitis and will treat with steroid burst and Augmentin .  Advised follow-up  if no gradual improvement with any further concerns.   Final Clinical Impressions(s) / UC Diagnoses   Final diagnoses:  Persistent cough  Sinobronchitis   Discharge Instructions   None    ED Prescriptions     Medication Sig Dispense Auth. Provider   predniSONE  (DELTASONE ) 20 MG tablet Take 2 tablets (40 mg total) by mouth daily with breakfast for 5 days. 10 tablet Billy Stabs F, PA-C   amoxicillin -clavulanate (AUGMENTIN ) 875-125 MG tablet Take 1 tablet by mouth every 12 (twelve) hours. 14 tablet Billy Stabs FALCON, PA-C      PDMP not reviewed this encounter.   Billy Stabs FALCON, PA-C 04/18/23 [phone]

## 2023-04-18 NOTE — ED Triage Notes (Addendum)
 Pt reports nasal congestion, productive cough w/ green sputum, and hoarse voice since 04/01/23. Pt reports she thought symptoms were improving with claritin  D at home but congestion worsened. Pt reports recently having nights where she felt like she could not breathe due to the mucus. Denies fevers at home. Negative covid test at home on 04/13/23 as well as 04/04/23.

## 2023-07-15 ENCOUNTER — Encounter: Payer: Self-pay | Admitting: Emergency Medicine

## 2023-07-15 ENCOUNTER — Ambulatory Visit
Admission: EM | Admit: 2023-07-15 | Discharge: 2023-07-15 | Disposition: A | Discharge status: Home / Self Care | Attending: Physician Assistant | Admitting: Physician Assistant

## 2023-07-15 DIAGNOSIS — J329 Chronic sinusitis, unspecified: Secondary | ICD-10-CM | POA: Diagnosis not present

## 2023-07-15 DIAGNOSIS — J40 Bronchitis, not specified as acute or chronic: Secondary | ICD-10-CM | POA: Diagnosis not present

## 2023-07-15 MED ORDER — PREDNISONE 20 MG PO TABS: 40.0000 mg | ORAL_TABLET | Freq: Every day | ORAL | 0 refills | Status: AC

## 2023-07-15 MED ORDER — AMOXICILLIN-POT CLAVULANATE 875-125 MG PO TABS: 1.0000 | ORAL_TABLET | Freq: Two times a day (BID) | ORAL | 0 refills | Status: DC

## 2023-07-15 NOTE — ED Triage Notes (Signed)
 Pt c/o cough, congestion, runny eyes, chest discomfort and ear pain since Sunday.

## 2023-07-15 NOTE — ED Provider Notes (Signed)
 [facility]    CSN: [id number] Arrival date & time: 07/15/23  0818      History   Chief Complaint Chief Complaint  Patient presents with   Nasal Congestion    HPI Shirley Ryan is a 43 y.o. female.   Patient presents today for evaluation of cough, congestion, watery eyes and ear pain that started a week ago.  She reports her cough is more of a barking cough.  She reports that her ear pain is more of an itching deeper in her ear rather than the pain.  She has not had fever.  She denies any vomiting or diarrhea.  She has been taking Claritin-D daily with resolution  The history is provided by the patient.    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      Home Medications    Prior to Admission medications   Medication Sig Start Date End Date Taking? Authorizing Provider  amoxicillin-clavulanate (AUGMENTIN) 875-125 MG tablet Take 1 tablet by mouth every 12 (twelve) hours. 07/15/23  Yes Tomi Bamberger, PA-C  predniSONE (DELTASONE) 20 MG tablet Take 2 tablets (40 mg total) by mouth daily with breakfast for 5 days. 07/15/23 07/20/23 Yes Tomi Bamberger, PA-C  benzonatate (TESSALON) 100 MG capsule Take 1 capsule (100 mg total) by mouth every 8 (eight) hours as needed for cough. Patient not taking: Reported on 04/18/2023 08/26/21   Gustavus Bryant, FNP  fluticasone [facility]) 50 MCG/ACT nasal spray Place 1 spray into both nostrils daily for 3 days. 08/26/21 04/18/23  Gustavus Bryant, FNP  gabapentin (NEURONTIN) 300 MG capsule Take 1 capsule (300 mg total) by mouth daily. Start with 300 mg Daily . May increase to 300 mg twice daily on the second day if needed. Patient not taking: Reported on 04/18/2023  11/16/20   Gustavus Bryant, FNP    Family History Family History  Problem Relation Age of Onset   Diabetes Mellitus II Father    Cancer Father    Breast cancer Maternal Grandmother     Social History Social History   Tobacco Use   Smoking status: Never   Smokeless tobacco: Never  Vaping Use   Vaping status: Never Used  Substance Use Topics   Alcohol use: No   Drug use: No     Allergies   Patient has no known allergies.   Review of Systems Review of Systems  Constitutional:  Negative for chills and fever.  HENT:  Positive for congestion and sinus pressure. Negative for ear pain and sore throat.   Eyes:  Negative for discharge and redness.  Respiratory:  Positive for cough. Negative for shortness of breath and wheezing.   Gastrointestinal:  Negative for abdominal pain, diarrhea, nausea and vomiting.     Physical Exam Triage Vital Signs ED Triage Vitals  Encounter Vitals Group     BP      Systolic BP Percentile      Diastolic BP Percentile      Pulse      Resp      Temp      Temp src  SpO2      Weight      Height      Head Circumference      Peak Flow      Pain Score      Pain Loc      Pain Education      Exclude from Growth Chart    No data found.  Updated Vital Signs BP 124/83 (BP Location: Right Arm)   Pulse 84   Temp 98.4 F (36.9 C) (Oral)   Resp 17   LMP 07/08/2023 (Approximate)   SpO2 97%   Visual Acuity Right Eye Distance:   Left Eye Distance:   Bilateral Distance:    Right Eye Near:   Left Eye Near:    Bilateral Near:     Physical Exam Vitals and nursing note reviewed.  Constitutional:      General: She is not in acute distress.    Appearance: Normal appearance. She is not ill-appearing.  HENT:     Head: Normocephalic and atraumatic.     Right Ear: Tympanic membrane normal.     Ears:     Comments: Unable to visualize left TM due to cerumen impaction    Nose: Congestion present.     Mouth/Throat:     Mouth: Mucous  membranes are moist.     Pharynx: No oropharyngeal exudate or posterior oropharyngeal erythema.  Eyes:     Conjunctiva/sclera: Conjunctivae normal.  Cardiovascular:     Rate and Rhythm: Normal rate and regular rhythm.     Heart sounds: Normal heart sounds. No murmur heard. Pulmonary:     Effort: Pulmonary effort is normal. No respiratory distress.     Breath sounds: Normal breath sounds. No wheezing, rhonchi or rales.  Skin:    General: Skin is warm and dry.  Neurological:     Mental Status: She is alert.  Psychiatric:        Mood and Affect: Mood normal.        Thought Content: Thought content normal.      UC Treatments / Results  Labs (all labs ordered are listed, but only abnormal results are displayed) Labs Reviewed - No data to display  EKG   Radiology No results found.  Procedures Procedures (including critical care time)  Medications Ordered in UC Medications - No data to display  Initial Impression / Assessment and Plan / UC Course  I have reviewed the triage vital signs and the nursing notes.  Pertinent labs & imaging results that were available during my care of the patient were reviewed by me and considered in my medical decision making (see chart for details).   Will treat to cover sinobronchitis with steroid burst and Augmentin.  Recommended she continue Claritin daily.  Advised follow-up if no gradual improvement or with any worsening symptoms.   Final Clinical Impressions(s) / UC Diagnoses   Final diagnoses:  Sinobronchitis   Discharge Instructions   None    ED Prescriptions     Medication Sig Dispense Auth. Provider   predniSONE (DELTASONE) 20 MG tablet Take 2 tablets (40 mg total) by mouth daily with breakfast for 5 days. 10 tablet Tomi Bamberger, PA-C   amoxicillin-clavulanate (AUGMENTIN) 875-125 MG tablet Take 1 tablet by mouth every 12 (twelve) hours. 14 tablet Tomi Bamberger, PA-C      PDMP not reviewed this encounter.   Tomi Bamberger, PA-C 07/15/23 [phone]

## 2023-07-25 ENCOUNTER — Other Ambulatory Visit: Payer: Self-pay | Admitting: Obstetrics and Gynecology

## 2023-07-25 DIAGNOSIS — Z1231 Encounter for screening mammogram for malignant neoplasm of breast: Secondary | ICD-10-CM

## 2023-08-08 ENCOUNTER — Ambulatory Visit
Admission: RE | Admit: 2023-08-08 | Discharge: 2023-08-08 | Disposition: A | Discharge status: Home / Self Care | Source: Ambulatory Visit | Attending: Obstetrics and Gynecology | Admitting: Obstetrics and Gynecology

## 2023-08-08 DIAGNOSIS — Z1231 Encounter for screening mammogram for malignant neoplasm of breast: Secondary | ICD-10-CM

## 2023-09-09 ENCOUNTER — Ambulatory Visit
Admission: EM | Admit: 2023-09-09 | Discharge: 2023-09-09 | Disposition: A | Discharge status: Home / Self Care | Attending: Physician Assistant | Admitting: Physician Assistant

## 2023-09-09 ENCOUNTER — Other Ambulatory Visit: Payer: Self-pay

## 2023-09-09 DIAGNOSIS — J4521 Mild intermittent asthma with (acute) exacerbation: Secondary | ICD-10-CM

## 2023-09-09 DIAGNOSIS — J0140 Acute pansinusitis, unspecified: Secondary | ICD-10-CM

## 2023-09-09 DIAGNOSIS — J309 Allergic rhinitis, unspecified: Secondary | ICD-10-CM | POA: Diagnosis not present

## 2023-09-09 DIAGNOSIS — H6122 Impacted cerumen, left ear: Secondary | ICD-10-CM

## 2023-09-09 MED ORDER — AMOXICILLIN-POT CLAVULANATE 875-125 MG PO TABS: 1.0000 | ORAL_TABLET | Freq: Two times a day (BID) | ORAL | 0 refills | Status: AC

## 2023-09-09 MED ORDER — ALBUTEROL SULFATE HFA 108 (90 BASE) MCG/ACT IN AERS: 1.0000 | INHALATION_SPRAY | Freq: Four times a day (QID) | RESPIRATORY_TRACT | 0 refills | Status: AC | PRN

## 2023-09-09 MED ORDER — AEROCHAMBER PLUS FLO-VU MEDIUM MISC
1.0000 | Freq: Once | Status: AC
  Administered 2023-09-09: 1

## 2023-09-09 MED ORDER — MONTELUKAST SODIUM 10 MG PO TABS: 10.0000 mg | ORAL_TABLET | Freq: Every day | ORAL | 1 refills | Status: AC

## 2023-09-09 NOTE — Discharge Instructions (Signed)
 Based on your symptoms today I believe that you likely have allergies I have attached some information for you to review to help reduce your contact with triggers and reduce symptom burden I recommend the following at this time:  Start taking a second generation antihistamine such as Allegra, Claritin , Zyrtec, Xyzal, etc (the generics of these medications are typically just as effective and less expensive).  I am also sending in a prescription strength antihistamine called montelukast, also known as Singulair.  Please take this plus one over-the-counter antihistamine of your choice daily.  To help with your sinus symptoms I am sending in a medication called Augmentin .  This is an antibiotic and should be taken twice per day for 7 days.  Please make sure that you finish the entire course of medication unless you are instructed to stop by medical provider or if you develop an allergic reaction. I also recommend watching the air-quality forecast and take note of high pollen days to help reduce your symptoms If you think other environmental allergens are causing your symptoms (such as mold, pet dander, fragrances, detergents, etc.) please try to identify them and remove them from your daily life. This can mean cleaning your home, bedding, clothing and other aspects of your environment a bit more often or with different products that are appropriate for allergies.   I have also sent in an albuterol inhaler for you to take to assist with your breathing/ coughing. This is typically what is called a "rescue inhaler" and you can use it when you have trouble breathing or can't stop coughing up to every 6 hours.  If you are having to use it more often than twice per day, every day for more than 2 weeks please let us  know as we may have to add a controller inhaler to your regimen until your symptoms reduce in severity.   If you feel like your symptoms are not improving or seem to be worsening you can return here  for further evaluation.  If at any point you start to develop fever that are not responding to medications, difficulty breathing, severe headaches, swelling around the eyes or nose, vision changes, severe neck pain or stiffness please return to urgent care or the emergency room for further evaluation as these could be signs of a worsening infection.

## 2023-09-09 NOTE — ED Triage Notes (Signed)
 Pt presents with complaints of sinus pressure and ear pressure/pain x 1 week. Pt states the ear pain and pressure has gotten progressively worse. States she cannot hear out of her left ear. Currently rates her overall pain an 8/10. OTC severe congestion Tylenol , Claritin , and cold & sinus Advil taken with relief. Denies fevers at home.

## 2023-09-09 NOTE — ED Provider Notes (Signed)
 [facility]    CSN: [id number] Arrival date & time: 09/09/23  1191      History   Chief Complaint Chief Complaint  Patient presents with   Sinus Pressure    Cough    HPI Shirley Ryan is a 43 y.o. female.   HPI  She reports having sinus pain and pressure, congestion, chest congestion  She states this has been ongoing for a bit but seemed to get worse over the last week She reports drainage seems to be worse at night, as is her coughing and chest congestion  She reports sinus pressure and associated headache  She also reports ear pressure and pain   She reports a work associate has pneumonia  Interventions: claritin  and tylenol  cold and sinus in AM. 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      Home Medications    Prior to Admission medications   Medication Sig Start Date End Date Taking? Authorizing Provider  albuterol (VENTOLIN HFA) 108 (90 Base) MCG/ACT inhaler Inhale 1-2 puffs into the lungs every 6 (six) hours as needed for wheezing or shortness of breath. 09/09/23  Yes Makael Stein E, PA-C  amoxicillin -clavulanate (AUGMENTIN ) 875-125 MG tablet Take 1 tablet by mouth every 12 (twelve) hours for 7 days. 09/09/23 09/16/23 Yes Clovia Reine E, PA-C  montelukast (SINGULAIR) 10 MG tablet Take 1 tablet (10 mg total) by mouth at bedtime. 09/09/23  Yes Salvatrice Morandi, Pearla Bottom, PA-C    Family History Family History  Problem Relation Age of Onset   Diabetes Mellitus II Father    Cancer Father    Breast cancer Maternal Grandmother      Social History Social History   Tobacco Use   Smoking status: Never   Smokeless tobacco: Never  Vaping Use   Vaping status: Never Used  Substance Use Topics   Alcohol use: No   Drug use: No     Allergies   Patient has no known allergies.   Review of Systems Review of Systems  Constitutional:  Positive for chills and fatigue. Negative for fever.  HENT:  Positive for congestion, ear pain (L>R), sinus pressure, sinus pain and sore throat (mildly irritated).   Respiratory:  Positive for cough and wheezing (at nighttime). Negative for shortness of breath.   Gastrointestinal:  Negative for diarrhea, nausea and vomiting.  Musculoskeletal:  Negative for myalgias.  Neurological:  Positive for headaches.     Physical Exam Triage Vital Signs ED Triage Vitals  Encounter Vitals Group     BP 09/09/23 0849 (!) 145/95     Systolic BP Percentile --      Diastolic BP Percentile --      Pulse Rate 09/09/23 0849 94     Resp 09/09/23 0849 19     Temp 09/09/23 0849 98.1 F (36.7 C)     Temp Source 09/09/23 0849 Oral     SpO2 09/09/23 0849 96 %     Weight 09/09/23  0851 198 lb (89.8 kg)     Height 09/09/23 0851 5\' 7"  (1.702 m)     Head Circumference --      Peak Flow --      Pain Score 09/09/23 0851 8     Pain Loc --      Pain Education --      Exclude from Growth Chart --    No data found.  Updated Vital Signs BP (!) 145/95 (BP Location: Right Arm)   Pulse 94   Temp 98.1 F (36.7 C) (Oral)   Resp 19   Ht 5\' 7"  (1.702 m)   Wt 198 lb (89.8 kg)   LMP 08/20/2023 (Exact Date)   SpO2 96%   BMI 31.01 kg/m   Visual Acuity Right Eye Distance:   Left Eye Distance:   Bilateral Distance:    Right Eye Near:   Left Eye Near:    Bilateral Near:     Physical Exam Vitals reviewed.  Constitutional:      General: She is awake.     Appearance: Normal appearance. She is well-developed and well-groomed.  HENT:     Head: Normocephalic and atraumatic.     Right Ear: Hearing,  tympanic membrane and ear canal normal.     Left Ear: Hearing, tympanic membrane and ear canal normal. There is impacted cerumen.     Mouth/Throat:     Lips: Pink.     Mouth: Mucous membranes are moist.     Pharynx: Oropharynx is clear. Uvula midline. No pharyngeal swelling, oropharyngeal exudate, posterior oropharyngeal erythema, uvula swelling or postnasal drip.  Eyes:     General: Gaze aligned appropriately. Allergic shiner present.        Right eye: No foreign body, discharge or hordeolum.        Left eye: No foreign body, discharge or hordeolum.     Extraocular Movements: Extraocular movements intact.     Conjunctiva/sclera: Conjunctivae normal.     Pupils: Pupils are equal, round, and reactive to light.  Cardiovascular:     Rate and Rhythm: Normal rate and regular rhythm.     Pulses: Normal pulses.          Radial pulses are 2+ on the right side and 2+ on the left side.     Heart sounds: Normal heart sounds. No murmur heard.    No friction rub. No gallop.  Pulmonary:     Effort: Pulmonary effort is normal.     Breath sounds: Normal breath sounds. No decreased air movement. No decreased breath sounds, wheezing, rhonchi or rales.  Musculoskeletal:     Cervical back: Normal range of motion and neck supple.  Lymphadenopathy:     Head:     Right side of head: No submental, submandibular or preauricular adenopathy.     Left side of head: No submental, submandibular or preauricular adenopathy.     Cervical:     Right cervical: No superficial cervical adenopathy.    Left cervical: No superficial cervical adenopathy.     Upper Body:     Right upper body: No supraclavicular adenopathy.     Left upper body: No supraclavicular adenopathy.  Neurological:     General: No focal deficit present.     Mental Status: She is alert and oriented to person, place, and time.  Psychiatric:        Mood and Affect: Mood normal.        Behavior: Behavior normal. Behavior is cooperative.  Thought Content: Thought content normal.        Judgment: Judgment normal.      UC Treatments / Results  Labs (all labs ordered are listed, but only abnormal results are displayed) Labs Reviewed - No data to display  EKG   Radiology No results found.  Procedures Procedures (including critical care time)  Medications Ordered in UC Medications  AeroChamber Plus Flo-Vu Medium MISC 1 each (1 each Other Given 09/09/23 1013)    Initial Impression / Assessment and Plan / UC Course  I have reviewed the triage vital signs and the nursing notes.  Pertinent labs & imaging results that were available during my care of the patient were reviewed by me and considered in my medical decision making (see chart for details).      Final Clinical Impressions(s) / UC Diagnoses   Final diagnoses:  Allergic rhinitis, unspecified seasonality, unspecified trigger  Acute pansinusitis, recurrence not specified  Mild intermittent reactive airway disease with acute exacerbation    Patient presents today with concerns of sinus pain, pressure, congestion, chest congestion that has been ongoing and getting worse over the last week.  She states that her symptoms appear to be getting worse at night and states that she has severe coughing as well as postnasal drainage at that time.  She has tried taking Claritin  and Zyrtec, Tylenol  Cold and sinus and Advil but this has not provided much relief.  Physical exam is notable for allergic shiners and sinus tenderness.  She does report that she had some ear fullness which was found to be due to impacted cerumen in the left ear.  Ear lavage was performed which provided significant relief for patient.  Tympanic membrane's bilaterally were pearly and opaque without signs of infection.  At this time given persistence of symptoms I recommend starting an antibiotic.  Will send in Augmentin  p.o. twice daily x 7 days for symptom management.  Will also send in albuterol inhaler  to assist with coughing and chest congestion.  Since patient has been taking 2 different over-the-counter antihistamines I am sending in montelukast 10 mg p.o. daily to assist with allergy symptoms.  Reviewed with patient that she would need to follow-up with PCP for further evaluation and ongoing medication management should she wish to continue this.  Recommend that she chooses 1 over-the-counter second-generation antihistamine to take in addition to the montelukast.  Reviewed that she can continue taking over-the-counter medications as needed for further symptomatic relief.  ED and return precautions reviewed and provided in after visit summary.  Follow-up as needed.   Discharge Instructions      Based on your symptoms today I believe that you likely have allergies I have attached some information for you to review to help reduce your contact with triggers and reduce symptom burden I recommend the following at this time:  Start taking a second generation antihistamine such as Allegra, Claritin , Zyrtec, Xyzal, etc (the generics of these medications are typically just as effective and less expensive).  I am also sending in a prescription strength antihistamine called montelukast, also known as Singulair.  Please take this plus one over-the-counter antihistamine of your choice daily.  To help with your sinus symptoms I am sending in a medication called Augmentin .  This is an antibiotic and should be taken twice per day for 7 days.  Please make sure that you finish the entire course of medication unless you are instructed to stop by medical provider or if you develop an allergic  reaction. I also recommend watching the air-quality forecast and take note of high pollen days to help reduce your symptoms If you think other environmental allergens are causing your symptoms (such as mold, pet dander, fragrances, detergents, etc.) please try to identify them and remove them from your daily life. This can mean  cleaning your home, bedding, clothing and other aspects of your environment a bit more often or with different products that are appropriate for allergies.   I have also sent in an albuterol inhaler for you to take to assist with your breathing/ coughing. This is typically what is called a "rescue inhaler" and you can use it when you have trouble breathing or can't stop coughing up to every 6 hours.  If you are having to use it more often than twice per day, every day for more than 2 weeks please let us  know as we may have to add a controller inhaler to your regimen until your symptoms reduce in severity.   If you feel like your symptoms are not improving or seem to be worsening you can return here for further evaluation.  If at any point you start to develop fever that are not responding to medications, difficulty breathing, severe headaches, swelling around the eyes or nose, vision changes, severe neck pain or stiffness please return to urgent care or the emergency room for further evaluation as these could be signs of a worsening infection.    ED Prescriptions     Medication Sig Dispense Auth. Provider   montelukast (SINGULAIR) 10 MG tablet Take 1 tablet (10 mg total) by mouth at bedtime. 30 tablet Emeril Stille E, PA-C   albuterol (VENTOLIN HFA) 108 (90 Base) MCG/ACT inhaler Inhale 1-2 puffs into the lungs every 6 (six) hours as needed for wheezing or shortness of breath. 8 g Denette Hass E, PA-C   amoxicillin -clavulanate (AUGMENTIN ) 875-125 MG tablet Take 1 tablet by mouth every 12 (twelve) hours for 7 days. 14 tablet Derrick Tiegs E, PA-C      PDMP not reviewed this encounter.   Jerona Mooring, PA-C 09/09/23 1429

## 2023-12-25 ENCOUNTER — Ambulatory Visit
Admission: RE | Admit: 2023-12-25 | Discharge: 2023-12-25 | Disposition: A | Discharge status: Home / Self Care | Attending: Physician Assistant | Admitting: Physician Assistant

## 2023-12-25 ENCOUNTER — Other Ambulatory Visit: Payer: Self-pay

## 2023-12-25 VITALS — BP 156/93 | HR 89 | Temp 98.6°F | Resp 19 | Ht 67.0 in | Wt 196.0 lb

## 2023-12-25 DIAGNOSIS — R0989 Other specified symptoms and signs involving the circulatory and respiratory systems: Secondary | ICD-10-CM | POA: Diagnosis not present

## 2023-12-25 DIAGNOSIS — R0982 Postnasal drip: Secondary | ICD-10-CM | POA: Diagnosis not present

## 2023-12-25 DIAGNOSIS — J309 Allergic rhinitis, unspecified: Secondary | ICD-10-CM | POA: Diagnosis not present

## 2023-12-25 LAB — POC COVID19/FLU A&B COMBO
Covid Antigen, POC: NEGATIVE
Influenza A Antigen, POC: NEGATIVE
Influenza B Antigen, POC: NEGATIVE

## 2023-12-25 LAB — POCT RAPID STREP A (OFFICE): Rapid Strep A Screen: NEGATIVE

## 2023-12-25 NOTE — ED Provider Notes (Signed)
 [facility]    CSN: [id number] Arrival date & time: 12/25/23  1613      History   Chief Complaint Chief Complaint  Patient presents with   Otalgia   Sore Throat   Nasal Congestion    HPI Shirley Ryan is a 43 y.o. female.  has a past medical history of No pertinent past medical history.   HPI  Discussed the use of AI scribe software for clinical note transcription with the patient, who gave verbal consent to proceed.  The patient presents with ear pain and pain on swallowing.  She has been experiencing right-sided ear pain and pain when swallowing since Saturday after mowing without a mask. The pain is on the right side, and she reports a sensation of swelling in the area. Yawning sometimes exacerbates the pain.  She has been using Tylenol  congestion medication, which helps with morning symptoms, including a sensation of congestion and occasional rhinorrhea. No fever, chills, dyspnea, myalgias, arthralgias, nausea, vomiting, diarrhea, or rashes.  She has a history of using Singulair  and an inhaler, which have been effective in managing her symptoms prior to this episode.  She mentions a recent exposure to a coworker who tested positive for COVID, and reports that her own COVID, flu, and strep tests were negative.    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      Home Medications    Prior to Admission medications   Medication Sig Start Date End Date Taking? Authorizing Provider  albuterol  (VENTOLIN  HFA) 108 (90 Base) MCG/ACT inhaler Inhale 1-2 puffs into the lungs every 6 (six) hours as needed for wheezing or shortness of breath. 09/09/23   Hannia Matchett E, PA-C  montelukast   (SINGULAIR ) 10 MG tablet Take 1 tablet (10 mg total) by mouth at bedtime. 09/09/23   Shayne Diguglielmo, Rocky BRAVO, PA-C    Family History Family History  Problem Relation Age of Onset   Diabetes Mellitus II Father    Cancer Father    Breast cancer Maternal Grandmother     Social History Social History   Tobacco Use   Smoking status: Never   Smokeless tobacco: Never  Vaping Use   Vaping status: Never Used  Substance Use Topics   Alcohol use: No   Drug use: No     Allergies   Patient has no known allergies.   Review of Systems Review of Systems  Constitutional:  Negative for chills and fever.  HENT:  Positive for congestion, ear pain, postnasal drip and sore throat.   Respiratory:  Negative for shortness of breath and wheezing.   Gastrointestinal:  Negative for diarrhea, nausea and vomiting.  Musculoskeletal:  Negative for arthralgias and myalgias.  Skin:  Negative for rash.  Hematological:  Positive for adenopathy (along the right side of the neck).     Physical Exam Triage Vital Signs ED Triage Vitals  Encounter Vitals Group     BP 12/25/23 1636 (!) 156/93     Girls Systolic BP Percentile --      Girls Diastolic BP Percentile --      Boys Systolic BP Percentile --  Boys Diastolic BP Percentile --      Pulse Rate 12/25/23 1636 89     Resp 12/25/23 1636 19     Temp 12/25/23 1636 98.6 F (37 C)     Temp Source 12/25/23 1636 Oral     SpO2 12/25/23 1636 95 %     Weight 12/25/23 1639 196 lb (88.9 kg)     Height 12/25/23 1639 5' 7 (1.702 m)     Head Circumference --      Peak Flow --      Pain Score 12/25/23 1637 5     Pain Loc --      Pain Education --      Exclude from Growth Chart --    No data found.  Updated Vital Signs BP (!) 156/93 (BP Location: Right Arm)   Pulse 89   Temp 98.6 F (37 C) (Oral)   Resp 19   Ht 5' 7 (1.702 m)   Wt 196 lb (88.9 kg)   LMP 12/11/2023 (Approximate)   SpO2 95%   BMI 30.70 kg/m   Visual Acuity Right Eye Distance:   Left  Eye Distance:   Bilateral Distance:    Right Eye Near:   Left Eye Near:    Bilateral Near:     Physical Exam Vitals reviewed.  Constitutional:      General: She is awake. She is not in acute distress.    Appearance: Normal appearance. She is well-developed and well-groomed. She is not ill-appearing, toxic-appearing or diaphoretic.  HENT:     Head: Normocephalic and atraumatic.     Right Ear: Hearing, tympanic membrane and ear canal normal.     Left Ear: Hearing, tympanic membrane and ear canal normal.     Mouth/Throat:     Lips: Pink.     Mouth: Mucous membranes are moist.     Pharynx: Oropharynx is clear. Uvula midline. No pharyngeal swelling, oropharyngeal exudate, posterior oropharyngeal erythema, uvula swelling or postnasal drip.     Tonsils: No tonsillar exudate or tonsillar abscesses. 0 on the right. 0 on the left.  Neck:     Trachea: Phonation normal. No abnormal tracheal secretions.  Cardiovascular:     Rate and Rhythm: Normal rate and regular rhythm.     Pulses: Normal pulses.          Radial pulses are 2+ on the right side and 2+ on the left side.     Heart sounds: Normal heart sounds. No murmur heard.    No friction rub. No gallop.  Pulmonary:     Effort: Pulmonary effort is normal.     Breath sounds: Normal breath sounds. No decreased air movement. No decreased breath sounds, wheezing, rhonchi or rales.  Musculoskeletal:     Cervical back: Normal range of motion and neck supple. No pain with movement.  Lymphadenopathy:     Head:     Right side of head: No submental, submandibular or preauricular adenopathy.     Left side of head: No submental, submandibular or preauricular adenopathy.     Cervical:     Right cervical: No superficial cervical adenopathy.    Left cervical: No superficial cervical adenopathy.     Upper Body:     Right upper body: No supraclavicular adenopathy.     Left upper body: No supraclavicular adenopathy.     Comments: Tenderness with  palpation of the submandibular space but no discrete swelling or palpable nodules.   Neurological:     Mental  Status: She is alert.  Psychiatric:        Mood and Affect: Mood normal.        Behavior: Behavior normal. Behavior is cooperative.      UC Treatments / Results  Labs (all labs ordered are listed, but only abnormal results are displayed) Labs Reviewed  POC COVID19/FLU A&B COMBO  POCT RAPID STREP A (OFFICE)    EKG   Radiology No results found.  Procedures Procedures (including critical care time)  Medications Ordered in UC Medications - No data to display  Initial Impression / Assessment and Plan / UC Course  I have reviewed the triage vital signs and the nursing notes.  Pertinent labs & imaging results that were available during my care of the patient were reviewed by me and considered in my medical decision making (see chart for details).      Final Clinical Impressions(s) / UC Diagnoses   Final diagnoses:  Symptoms of upper respiratory infection (URI)  Allergic rhinitis with postnasal drip   Allergic rhinitis with associated ear and throat pain Acute onset of ear and throat pain following allergen exposure while mowing without a mask. Symptoms include odynophagia, pain on yawning, and sensation of swelling on the right side. No fever, chills, or respiratory distress. Negative COVID, flu, and strep tests. Examination reveals no definitive swelling or xerostomia, suggesting low likelihood of salivary gland involvement. Likely exacerbation of allergic rhinitis due to allergen exposure. - Add an antihistamine such as Claritin  or Zyrtec to current Singulair  regimen. - Continue using acetaminophen  for analgesia. - Alternate acetaminophen  with ibuprofen if needed for pain management. - Apply warm compresses to the affected area. - Consider using sour candies to stimulate salivary glands if irritation persists.    Discharge Instructions      VISIT  SUMMARY:  You came in today with right-sided ear pain and pain when swallowing, which started after mowing without a mask. You also reported a sensation of swelling in the area, and yawning sometimes makes the pain worse. You have been using Tylenol  congestion medication, which helps with morning symptoms, including a sensation of congestion and occasional runny nose. You have no fever, chills, or other significant symptoms. Your COVID, flu, and strep tests were negative.  YOUR PLAN:  -ALLERGIC RHINITIS WITH ASSOCIATED EAR AND THROAT PAIN: Allergic rhinitis is an allergic reaction that causes sneezing, congestion, and a runny nose. In your case, it has also caused ear and throat pain. This likely started after you were exposed to allergens while mowing without a mask. To manage your symptoms, you should add an antihistamine like Claritin  or Zyrtec to your current Singulair  regimen. Continue using acetaminophen  for pain relief, and you can alternate it with ibuprofen if needed. Applying warm compresses to the affected area may also help. If the irritation persists, consider using sour candies to stimulate your salivary glands.  INSTRUCTIONS:  Please follow up if your symptoms do not improve or if they worsen. Continue with the current treatment plan and consider wearing a mask when mowing in the future to avoid allergen exposure.     ED Prescriptions   None    PDMP not reviewed this encounter.   Marylene Rocky BRAVO, PA-C 12/25/23 1736

## 2023-12-25 NOTE — Discharge Instructions (Addendum)
 VISIT SUMMARY:  You came in today with right-sided ear pain and pain when swallowing, which started after mowing without a mask. You also reported a sensation of swelling in the area, and yawning sometimes makes the pain worse. You have been using Tylenol  congestion medication, which helps with morning symptoms, including a sensation of congestion and occasional runny nose. You have no fever, chills, or other significant symptoms. Your COVID, flu, and strep tests were negative.  YOUR PLAN:  -ALLERGIC RHINITIS WITH ASSOCIATED EAR AND THROAT PAIN: Allergic rhinitis is an allergic reaction that causes sneezing, congestion, and a runny nose. In your case, it has also caused ear and throat pain. This likely started after you were exposed to allergens while mowing without a mask. To manage your symptoms, you should add an antihistamine like Claritin  or Zyrtec to your current Singulair  regimen. Continue using acetaminophen  for pain relief, and you can alternate it with ibuprofen if needed. Applying warm compresses to the affected area may also help. If the irritation persists, consider using sour candies to stimulate your salivary glands.  INSTRUCTIONS:  Please follow up if your symptoms do not improve or if they worsen. Continue with the current treatment plan and consider wearing a mask when mowing in the future to avoid allergen exposure.

## 2023-12-25 NOTE — ED Triage Notes (Signed)
 Pt presents with complaints of right ear pain, nasal congestion, and sore throat x 4 days. Denies fevers at home. OTC Cold & Flu Tylenol  taken with relief in pain. Currently rates overall pain a 5/10. Feels very congested. Having facial pressure. Symptoms began after mowing the grass on Saturday 9/13.

## 2024-01-11 ENCOUNTER — Other Ambulatory Visit: Payer: Self-pay

## 2024-01-11 ENCOUNTER — Ambulatory Visit
Admission: RE | Admit: 2024-01-11 | Discharge: 2024-01-11 | Disposition: A | Discharge status: Home / Self Care | Attending: Family Medicine | Admitting: Family Medicine

## 2024-01-11 VITALS — BP 127/88 | HR 80 | Temp 98.5°F | Resp 19 | Ht 67.0 in | Wt 196.0 lb

## 2024-01-11 DIAGNOSIS — J0100 Acute maxillary sinusitis, unspecified: Secondary | ICD-10-CM

## 2024-01-11 MED ORDER — AMOXICILLIN-POT CLAVULANATE 875-125 MG PO TABS: 1.0000 | ORAL_TABLET | Freq: Two times a day (BID) | ORAL | 0 refills | Status: AC

## 2024-01-11 MED ORDER — METHYLPREDNISOLONE 4 MG PO TBPK: ORAL_TABLET | ORAL | 0 refills | Status: AC

## 2024-01-11 NOTE — ED Triage Notes (Signed)
 Pt presents with a chief complaint of productive cough x 3-4 weeks. Was seen here on 9/16. Symptoms have only worsened. States when she coughs, sometimes green mucus comes up. Currently rates overall pain a 5/10, pointing to chest area. Pt reports she believes her chest is sore due to coughing. Taking OTC Zyrtec + Claritin  daily. Have also added Tylenol  cold & cough + Advil sinus & congestion. No improvement. No fevers.

## 2024-01-11 NOTE — Discharge Instructions (Signed)
 You were diagnosed with a sinus infection today. I have sent out an antibiotic and steroid to your pharmacy.  You may continue over the counter medications as well.  Please return if not improving or worsening.

## 2024-01-11 NOTE — ED Provider Notes (Signed)
 [facility]    CSN: [id number] Arrival date & time: 01/11/24  0847      History   Chief Complaint Chief Complaint  Patient presents with   Cough    Cough has continued to get worse since last visit. Runny nose, stuffy nose, - Entered by patient    HPI Shirley Ryan is a 43 y.o. female.    Cough Associated symptoms: rhinorrhea and shortness of breath    Patient is here for URI symptoms for several weeks.  Primary the cough is the worst part.  Green mucous, sputum.  She also having sinus congestion, drainage.  Sinus pain/pressure for the last 3 days.  Using medications without much help.  Not able to sleep the last 3 nights.  Having some wheezing/sob at times, after coughing.    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      Home Medications    Prior to Admission medications   Medication Sig Start Date End Date Taking? Authorizing Provider  albuterol  (VENTOLIN  HFA) 108 (90 Base) MCG/ACT inhaler Inhale 1-2 puffs into the lungs every 6 (six) hours as needed for wheezing or shortness of breath. 09/09/23   Mecum, Latina Frank E, PA-C  montelukast  (SINGULAIR ) 10 MG tablet Take 1 tablet (10 mg total) by mouth at bedtime. 09/09/23   Mecum, Rocky BRAVO, PA-C    Family History Family History  Problem Relation Age of Onset   Diabetes Mellitus II Father    Cancer Father    Breast cancer Maternal Grandmother     Social History Social History   Tobacco Use   Smoking status: Never   Smokeless tobacco: Never  Vaping Use   Vaping status: Never Used  Substance Use Topics   Alcohol use: No   Drug use: No     Allergies   Patient has no known allergies.   Review of Systems Review of  Systems  Constitutional: Negative.   HENT:  Positive for congestion, rhinorrhea, sinus pressure and sinus pain.   Respiratory:  Positive for cough and shortness of breath.   Cardiovascular: Negative.   Gastrointestinal: Negative.   Genitourinary: Negative.   Musculoskeletal: Negative.   Psychiatric/Behavioral: Negative.       Physical Exam Triage Vital Signs ED Triage Vitals  Encounter Vitals Group     BP 01/11/24 0913 127/88     Girls Systolic BP Percentile --      Girls Diastolic BP Percentile --      Boys Systolic BP Percentile --      Boys Diastolic BP Percentile --      Pulse Rate 01/11/24 0913 80     Resp 01/11/24 0913 19     Temp 01/11/24 0913 98.5 F (36.9 C)     Temp Source 01/11/24 0913 Oral     SpO2 01/11/24 0913 95 %     Weight 01/11/24 0915 195 lb 15.8 oz (88.9 kg)     Height 01/11/24 0915 5' 7 (1.702 m)     Head Circumference --      Peak Flow --      Pain Score 01/11/24 0914 5  Pain Loc --      Pain Education --      Exclude from Growth Chart --    No data found.  Updated Vital Signs BP 127/88 (BP Location: Right Arm)   Pulse 80   Temp 98.5 F (36.9 C) (Oral)   Resp 19   Ht 5' 7 (1.702 m)   Wt 88.9 kg   LMP 01/09/2024 (Exact Date)   SpO2 97%   BMI 30.70 kg/m   Visual Acuity Right Eye Distance:   Left Eye Distance:   Bilateral Distance:    Right Eye Near:   Left Eye Near:    Bilateral Near:     Physical Exam Constitutional:      General: She is not in acute distress.    Appearance: Normal appearance. She is normal weight. She is not ill-appearing or toxic-appearing.  HENT:     Nose: Congestion and rhinorrhea present.     Right Sinus: Maxillary sinus tenderness and frontal sinus tenderness present.     Left Sinus: Maxillary sinus tenderness and frontal sinus tenderness present.  Cardiovascular:     Rate and Rhythm: Normal rate and regular rhythm.  Pulmonary:     Effort: Pulmonary effort is normal. No respiratory distress.      Breath sounds: Normal breath sounds. No wheezing or rhonchi.  Musculoskeletal:     Cervical back: Normal range of motion and neck supple. No tenderness.  Lymphadenopathy:     Cervical: No cervical adenopathy.  Skin:    General: Skin is warm.  Neurological:     General: No focal deficit present.     Mental Status: She is alert.  Psychiatric:        Mood and Affect: Mood normal.      UC Treatments / Results  Labs (all labs ordered are listed, but only abnormal results are displayed) Labs Reviewed - No data to display  EKG   Radiology No results found.  Procedures Procedures (including critical care time)  Medications Ordered in UC Medications - No data to display  Initial Impression / Assessment and Plan / UC Course  I have reviewed the triage vital signs and the nursing notes.  Pertinent labs & imaging results that were available during my care of the patient were reviewed by me and considered in my medical decision making (see chart for details).   Final Clinical Impressions(s) / UC Diagnoses   Final diagnoses:  Acute non-recurrent maxillary sinusitis     Discharge Instructions      You were diagnosed with a sinus infection today. I have sent out an antibiotic and steroid to your pharmacy.  You may continue over the counter medications as well.  Please return if not improving or worsening.     ED Prescriptions     Medication Sig Dispense Auth. Provider   amoxicillin -clavulanate (AUGMENTIN ) 875-125 MG tablet Take 1 tablet by mouth every 12 (twelve) hours. 14 tablet Mallory Schaad, MD   methylPREDNISolone (MEDROL DOSEPAK) 4 MG TBPK tablet Take as directed 1 each Darral Longs, MD      PDMP not reviewed this encounter.   Darral Longs, MD 01/11/24 [phone]
